# Patient Record
Sex: Female | Born: 1975 | Race: Black or African American | Hispanic: No | Marital: Married | Smoking: Never smoker
Health system: Southern US, Community
[De-identification: ages and names within clinical notes are randomized; demographics above are authoritative.]

## PROBLEM LIST (undated history)

## (undated) DIAGNOSIS — F329 Major depressive disorder, single episode, unspecified: Secondary | ICD-10-CM

## (undated) DIAGNOSIS — F32A Depression, unspecified: Secondary | ICD-10-CM

## (undated) DIAGNOSIS — E282 Polycystic ovarian syndrome: Secondary | ICD-10-CM

## (undated) DIAGNOSIS — Z8742 Personal history of other diseases of the female genital tract: Secondary | ICD-10-CM

## (undated) DIAGNOSIS — Z9889 Other specified postprocedural states: Secondary | ICD-10-CM

## (undated) DIAGNOSIS — N83209 Unspecified ovarian cyst, unspecified side: Secondary | ICD-10-CM

## (undated) HISTORY — DX: Other specified postprocedural states: Z98.890

## (undated) HISTORY — DX: Depression, unspecified: F32.A

## (undated) HISTORY — PX: OTHER SURGICAL HISTORY: SHX169

## (undated) HISTORY — DX: Unspecified ovarian cyst, unspecified side: N83.209

## (undated) HISTORY — DX: Major depressive disorder, single episode, unspecified: F32.9

## (undated) HISTORY — PX: CERVICAL CERCLAGE: SHX1329

## (undated) HISTORY — DX: Personal history of other diseases of the female genital tract: Z87.42

---
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
## 2005-09-08 ENCOUNTER — Emergency Department: Payer: Self-pay | Admitting: Emergency Medicine

## 2006-09-01 DIAGNOSIS — Z8742 Personal history of other diseases of the female genital tract: Secondary | ICD-10-CM

## 2006-09-01 HISTORY — DX: Personal history of other diseases of the female genital tract: Z87.42

## 2007-02-09 ENCOUNTER — Ambulatory Visit: Payer: Self-pay | Admitting: Internal Medicine

## 2007-09-08 ENCOUNTER — Emergency Department: Payer: Self-pay | Admitting: Emergency Medicine

## 2008-08-08 ENCOUNTER — Emergency Department: Payer: Self-pay | Admitting: Emergency Medicine

## 2009-10-02 ENCOUNTER — Emergency Department: Payer: Self-pay | Admitting: Emergency Medicine

## 2011-11-22 ENCOUNTER — Emergency Department: Payer: Self-pay | Admitting: Emergency Medicine

## 2012-05-08 ENCOUNTER — Emergency Department: Payer: Self-pay | Admitting: Emergency Medicine

## 2012-05-09 ENCOUNTER — Emergency Department: Payer: Self-pay | Admitting: Emergency Medicine

## 2012-05-11 ENCOUNTER — Emergency Department: Payer: Self-pay | Admitting: Emergency Medicine

## 2012-05-14 LAB — WOUND CULTURE

## 2012-08-18 ENCOUNTER — Other Ambulatory Visit (HOSPITAL_COMMUNITY)
Admission: RE | Admit: 2012-08-18 | Discharge: 2012-08-18 | Disposition: A | Discharge status: Home / Self Care | Payer: Commercial Managed Care - PPO | Source: Ambulatory Visit | Attending: Family Medicine | Admitting: Family Medicine

## 2012-08-18 ENCOUNTER — Encounter: Payer: Self-pay | Admitting: Family Medicine

## 2012-08-18 ENCOUNTER — Ambulatory Visit (INDEPENDENT_AMBULATORY_CARE_PROVIDER_SITE_OTHER): Payer: Commercial Managed Care - PPO | Admitting: Family Medicine

## 2012-08-18 VITALS — BP 122/70 | HR 72 | Temp 97.8°F | Ht 64.0 in | Wt 235.0 lb

## 2012-08-18 DIAGNOSIS — Z1151 Encounter for screening for human papillomavirus (HPV): Secondary | ICD-10-CM | POA: Insufficient documentation

## 2012-08-18 DIAGNOSIS — Z0000 Encounter for general adult medical examination without abnormal findings: Secondary | ICD-10-CM

## 2012-08-18 DIAGNOSIS — E669 Obesity, unspecified: Secondary | ICD-10-CM | POA: Insufficient documentation

## 2012-08-18 DIAGNOSIS — Z113 Encounter for screening for infections with a predominantly sexual mode of transmission: Secondary | ICD-10-CM

## 2012-08-18 DIAGNOSIS — Z01419 Encounter for gynecological examination (general) (routine) without abnormal findings: Secondary | ICD-10-CM | POA: Insufficient documentation

## 2012-08-18 DIAGNOSIS — Z136 Encounter for screening for cardiovascular disorders: Secondary | ICD-10-CM

## 2012-08-18 LAB — COMPREHENSIVE METABOLIC PANEL
ALT: 17 U/L (ref 0–35)
AST: 18 U/L (ref 0–37)
Albumin: 4.1 g/dL (ref 3.5–5.2)
Alkaline Phosphatase: 67 U/L (ref 39–117)
Calcium: 9.4 mg/dL (ref 8.4–10.5)
Chloride: 105 mEq/L (ref 96–112)
Potassium: 3.6 mEq/L (ref 3.5–5.1)
Sodium: 139 mEq/L (ref 135–145)
Total Protein: 7.5 g/dL (ref 6.0–8.3)

## 2012-08-18 LAB — HEMOGLOBIN A1C: Hgb A1c MFr Bld: 6.1 % (ref 4.6–6.5)

## 2012-08-18 LAB — LIPID PANEL
LDL Cholesterol: 31 mg/dL (ref 0–99)
Total CHOL/HDL Ratio: 2

## 2012-08-18 NOTE — Progress Notes (Signed)
Subjective:    Patient ID: Shirley Garcia, female    DOB: 08/06/1976, 36 y.o.   MRN: [id number]  HPI  G2P2 here to establish care and for CPX.  No h/o abnormal pap smears.  Last pap smear was before birth of her son who is now 68 yo. No known family history of cervical or ovarian CA. Great aunt had breast CA.  She is not currently sexually active.  She would like STD screening.  Denies any vaginal discharge or dysuria.  Weight gain- gained 80 pounds in last several years.  Wakes up in the middle of night craving sweets.  She does have a h/o gestational diabetes. No increased thirst or urination.   She is not physical active although she has an active job as Lawyer. Feels she is not taking in more calories but she admits to not watching her intake carefully.  Patient Active Problem List  Diagnosis  . Routine general medical examination at a health care facility  . Routine gynecological examination  . Obesity   Past Medical History  Diagnosis Date  . Depression   . Gestational diabetes mellitus in childbirth    Past Surgical History  Procedure Date  . Cervix polpys   . Cesarean section    History  Substance Use Topics  . Smoking status: Never Smoker   . Smokeless tobacco: Not on file  . Alcohol Use: Not on file   No family history on file. No Known Allergies No current outpatient prescriptions on file prior to visit.   The PMH, PSH, Social History, Family History, Medications, and allergies have been reviewed in [facility], and have been updated if relevant.   Review of Systems See HPI Patient reports no  vision/ hearing changes,anorexia,  fever ,adenopathy, persistant / recurrent hoarseness, swallowing issues, chest pain, edema,persistant / recurrent cough, hemoptysis, dyspnea(rest, exertional, paroxysmal nocturnal), gastrointestinal  bleeding (melena, rectal bleeding), abdominal pain, excessive heart burn, GU symptoms(dysuria, hematuria, pyuria, voiding/incontinence  Issues)  syncope, focal weakness, severe memory loss, concerning skin lesions, depression, anxiety, abnormal bruising/bleeding, major joint swelling, breast masses or abnormal vaginal bleeding.       Objective:   Physical Exam BP 122/70  Pulse 72  Temp 97.8 F (36.6 C)  Ht 5\' 4"  (1.626 m)  Wt 235 lb (106.595 kg)  BMI 40.34 kg/m2  General:  Obese,Well-developed,well-nourished,in no acute distress; alert,appropriate and cooperative throughout examination Head:  normocephalic and atraumatic.   Eyes:  vision grossly intact, pupils equal, pupils round, and pupils reactive to light.   Ears:  R ear normal and L ear normal.   Nose:  no external deformity.   Mouth:  good dentition.   Neck:  No deformities, masses, or tenderness noted. Breasts:  No mass, nodules, thickening, tenderness, bulging, retraction, inflamation, nipple discharge or skin changes noted.   Lungs:  Normal respiratory effort, chest expands symmetrically. Lungs are clear to auscultation, no crackles or wheezes. Heart:  Normal rate and regular rhythm. S1 and S2 normal without gallop, murmur, click, rub or other extra sounds. Abdomen:  Bowel sounds positive,abdomen soft and non-tender without masses, organomegaly or hernias noted. Rectal:  no external abnormalities.   Genitalia:  Pelvic Exam:        External: normal female genitalia without lesions or masses        Vagina: normal without lesions or masses        Cervix: normal without lesions or masses        Adnexa: normal bimanual exam without masses or  fullness        Uterus: normal by palpation        Pap smear: performed Msk:  No deformity or scoliosis noted of thoracic or lumbar spine.   Extremities:  No clubbing, cyanosis, edema, or deformity noted with normal full range of motion of all joints.   Neurologic:  alert & oriented X3 and gait normal.   Skin:  Intact without suspicious lesions or rashes Cervical Nodes:  No lymphadenopathy noted Axillary Nodes:  No palpable  lymphadenopathy Psych:  Cognition and judgment appear intact. Alert and cooperative with normal attention span and concentration. No apparent delusions, illusions, hallucinations     Assessment & Plan:   1. Routine general medical examination at a health care facility  Reviewed preventive care protocols, scheduled due services, and updated immunizations Discussed nutrition, exercise, diet, and healthy lifestyle.  Comprehensive metabolic panel  2. Routine gynecological examination  Discussed sexual activity, pregnancy risk, and STD risk.  Encouraged to get regular exercise.  pap  3. Obesity  Deteriorated.  Discussed keeping food journal and trying to increase physical activity.  Check blood work today.  If all normal, we could consider a trial of phentermine.  Discussed risks including HTN, stroke and pulmonary HTN with pt today. Hemoglobin A1c, TSH  4. Screening for ischemic heart disease  Lipid Panel  5. Screening for STD (sexually transmitted disease)  HIV Antibody, RPR

## 2012-08-18 NOTE — Patient Instructions (Addendum)
Good to see you. I hope you and your family have a wonderful Christmas.  We will call you with your lab results and send a letter with your pap smear results (unless it is abnormal and then we will call you).

## 2012-08-19 ENCOUNTER — Telehealth: Payer: Self-pay | Admitting: *Deleted

## 2012-08-19 LAB — RPR

## 2012-08-19 LAB — HIV ANTIBODY (ROUTINE TESTING W REFLEX): HIV: NONREACTIVE

## 2012-08-19 NOTE — Telephone Encounter (Signed)
Noted. Please print out phentermine rx.  She needs to follow up in 1 month.  Please also advise to take in am between 9 and 11 am.

## 2012-08-19 NOTE — Telephone Encounter (Signed)
Pt states she was told that if her blood work was normal she would be prescribed an appetite suppressant.  She would like to try something.  Please advise.

## 2012-08-20 MED ORDER — PHENTERMINE HCL 15 MG PO CAPS: 15.0000 mg | ORAL_CAPSULE | ORAL | Status: DC

## 2012-08-20 NOTE — Telephone Encounter (Signed)
Advised patient as instructed, script placed at front desk for pick up.  She said she will call back to schedule follow up appt.

## 2012-08-26 ENCOUNTER — Encounter: Payer: Self-pay | Admitting: Family Medicine

## 2012-08-26 LAB — HM PAP SMEAR: HM Pap smear: NORMAL

## 2012-09-15 ENCOUNTER — Encounter: Payer: Self-pay | Admitting: Family Medicine

## 2012-09-15 ENCOUNTER — Ambulatory Visit (INDEPENDENT_AMBULATORY_CARE_PROVIDER_SITE_OTHER): Payer: Commercial Managed Care - PPO | Admitting: Family Medicine

## 2012-09-15 VITALS — BP 120/76 | HR 80 | Temp 98.0°F | Wt 232.0 lb

## 2012-09-15 DIAGNOSIS — E669 Obesity, unspecified: Secondary | ICD-10-CM

## 2012-09-15 MED ORDER — PHENTERMINE HCL 30 MG PO CAPS: 30.0000 mg | ORAL_CAPSULE | ORAL | Status: DC

## 2012-09-15 NOTE — Progress Notes (Signed)
  Subjective:    Patient ID: Shirley Garcia, female    DOB: Oct 29, 1975, 37 y.o.   MRN: [id number]  HPI Very pleasant female her for follow up obesity.  Start Phentermine 15 mg daily last month.  Denies any CP, SOB, palpitations, HA or LE edema. She has also been exercising at the gym at [facility] a few days per week.  Has only lost 3 pounds which is a little frustrating for her.  Wt Readings from Last 3 Encounters:  09/15/12 232 lb (105.235 kg)  08/18/12 235 lb (106.595 kg)     Patient Active Problem List  Diagnosis  . Routine general medical examination at a health care facility  . Routine gynecological examination  . Obesity   Past Medical History  Diagnosis Date  . Depression   . Gestational diabetes mellitus in childbirth    Past Surgical History  Procedure Date  . Cervix polpys   . Cesarean section    History  Substance Use Topics  . Smoking status: Never Smoker   . Smokeless tobacco: Not on file  . Alcohol Use: Not on file   No family history on file. No Known Allergies Current Outpatient Prescriptions on File Prior to Visit  Medication Sig Dispense Refill  . phentermine 15 MG capsule Take 1 capsule (15 mg total) by mouth every morning.  30 capsule  0   The PMH, PSH, Social History, Family History, Medications, and allergies have been reviewed in [facility], and have been updated if relevant.    Review of Systems See HPI    Objective:   Physical Exam BP 120/76  Pulse 80  Temp 98 F (36.7 C)  Wt 232 lb (105.235 kg) Gen:  Alert, overweight appearing, NAD Resp:  CTA bilaterally CVS:  RRR Ext:  No edema    Assessment & Plan:   1. Obesity    Improved but has only lost a few pounds.  Will increase phentermine to 30 mg daily.  Follow up in 1 month.

## 2012-09-15 NOTE — Patient Instructions (Addendum)
Great to see you. We are increasing your phentermine to 30 mg- try to take it around 9 am.  Please follow up in 1 month.

## 2012-10-11 ENCOUNTER — Other Ambulatory Visit: Payer: Self-pay

## 2012-10-11 MED ORDER — PHENTERMINE HCL 30 MG PO CAPS: 30.0000 mg | ORAL_CAPSULE | ORAL | Status: DC

## 2012-10-11 NOTE — Telephone Encounter (Signed)
Medicine called to [facility], which is where pt requested script be sent.

## 2012-10-11 NOTE — Telephone Encounter (Signed)
Pt left v/m requesting refill phentermine 30 mg to [facility]; pt will be out of med 10/18/12 and has appt 10/21/12.Please advise.

## 2012-10-21 ENCOUNTER — Ambulatory Visit (INDEPENDENT_AMBULATORY_CARE_PROVIDER_SITE_OTHER): Payer: Commercial Managed Care - PPO | Admitting: Family Medicine

## 2012-10-21 ENCOUNTER — Encounter: Payer: Self-pay | Admitting: Family Medicine

## 2012-10-21 VITALS — BP 120/80 | HR 76 | Temp 97.6°F | Wt 224.0 lb

## 2012-10-21 DIAGNOSIS — E669 Obesity, unspecified: Secondary | ICD-10-CM

## 2012-10-21 MED ORDER — PHENTERMINE HCL 37.5 MG PO CAPS: 37.5000 mg | ORAL_CAPSULE | ORAL | Status: DC

## 2012-10-21 NOTE — Patient Instructions (Addendum)
Good to see you. Congratulations on your weight loss. We are increasing your phentermine to 37.5 mg daily.  Follow up in 1 month.

## 2012-10-21 NOTE — Progress Notes (Signed)
  Subjective:    Patient ID: Shirley Garcia, female    DOB: [birth year], 37 y.o.   MRN: [id number]  HPI Very pleasant female her for follow up obesity.  Start Phentermine 15 mg daily in December.  Denies any CP, SOB, palpitations, HA or LE edema. She has also been exercising at the gym at [facility] a few days per week.  Has done quite well.  Wt Readings from Last 3 Encounters:  10/21/12 224 lb (101.606 kg)  09/15/12 232 lb (105.235 kg)  08/18/12 235 lb (106.595 kg)      Patient Active Problem List  Diagnosis  . Obesity   Past Medical History  Diagnosis Date  . Depression   . Gestational diabetes mellitus in childbirth    Past Surgical History  Procedure Laterality Date  . Cervix polpys    . Cesarean section     History  Substance Use Topics  . Smoking status: Never Smoker   . Smokeless tobacco: Not on file  . Alcohol Use: Not on file   No family history on file. No Known Allergies Current Outpatient Prescriptions on File Prior to Visit  Medication Sig Dispense Refill  . phentermine 30 MG capsule Take 1 capsule (30 mg total) by mouth every morning.  30 capsule  0   No current facility-administered medications on file prior to visit.   The PMH, PSH, Social History, Family History, Medications, and allergies have been reviewed in [facility], and have been updated if relevant.    Review of Systems See HPI    Objective:   Physical Exam BP 120/80  Pulse 76  Temp(Src) 97.6 F (36.4 C)  Wt 224 lb (101.606 kg)  BMI 38.43 kg/m2 Gen:  Alert, overweight appearing, NAD Resp:  CTA bilaterally CVS:  RRR Ext:  No edema    Assessment & Plan:   Obesity Improved.   Will increase phentermine to 37.5  mg daily.  Follow up in 1 month. The patient indicates understanding of these issues and agrees with the plan.

## 2012-11-08 ENCOUNTER — Telehealth: Payer: Self-pay | Admitting: Family Medicine

## 2012-11-08 NOTE — Telephone Encounter (Signed)
Patient Information:  Caller Name: Danasha  Phone: [phone]  Patient: Shirley Garcia  Gender: Female  DOB: 1976/01/03  Age: 37 Years  PC[facility]  Pregnant: No  Office Follow Up:  Does the office need to follow up with this patient?: No  Instructions For The Office: N/A  RN Note:  Has been taking Motrin 600mg  every 8hrs but helps briefly.  Symptoms  Reason For Call & Symptoms: Thinks has pulled a muscle in lower back. Happened 3-3.  Reviewed Health History In EMR: Yes  Reviewed Medications In EMR: Yes  Reviewed Allergies In EMR: Yes  Reviewed Surgeries / Procedures: Yes  Date of Onset of Symptoms: 11/01/2012  Treatments Tried: ice heat Motrin  Treatments Tried Worked: No OB / GYN:  LMP: 10/16/2012  Guideline(s) Used:  Back Pain  Disposition Per Guideline:   See Today or Tomorrow in Office  Reason For Disposition Reached:   Patient wants to be seen  Advice Given:  Cold or Heat:  Heat Pack: If pain lasts over 2 days, apply heat to the sore area. Use a heat pack, heating pad, or warm wet washcloth. Do this for 10 minutes, then as needed. For widespread stiffness, take a hot bath or hot shower instead. Move the sore area under the warm water.

## 2012-11-19 ENCOUNTER — Other Ambulatory Visit: Payer: Self-pay

## 2012-11-19 MED ORDER — PHENTERMINE HCL 37.5 MG PO CAPS: 37.5000 mg | ORAL_CAPSULE | ORAL | Status: DC

## 2012-11-19 NOTE — Telephone Encounter (Signed)
Pt request ref[facility]. Pt will be out of med 11/20/12 and has appt with Dr Dayton Martes 11/22/12.Please advise.

## 2012-11-19 NOTE — Telephone Encounter (Signed)
[facility] 

## 2012-11-22 ENCOUNTER — Ambulatory Visit: Payer: Commercial Managed Care - PPO | Admitting: Family Medicine

## 2013-02-24 ENCOUNTER — Ambulatory Visit: Payer: Commercial Managed Care - PPO | Admitting: Family Medicine

## 2013-02-25 ENCOUNTER — Ambulatory Visit: Payer: Commercial Managed Care - PPO | Admitting: Family Medicine

## 2013-02-28 ENCOUNTER — Encounter: Payer: Self-pay | Admitting: *Deleted

## 2013-03-01 ENCOUNTER — Encounter: Payer: Self-pay | Admitting: Family Medicine

## 2013-03-01 ENCOUNTER — Ambulatory Visit (INDEPENDENT_AMBULATORY_CARE_PROVIDER_SITE_OTHER): Payer: Commercial Managed Care - PPO | Admitting: Family Medicine

## 2013-03-01 VITALS — BP 110/80 | HR 72 | Temp 97.8°F | Wt 227.0 lb

## 2013-03-01 DIAGNOSIS — N63 Unspecified lump in unspecified breast: Secondary | ICD-10-CM

## 2013-03-01 DIAGNOSIS — N6320 Unspecified lump in the left breast, unspecified quadrant: Secondary | ICD-10-CM | POA: Insufficient documentation

## 2013-03-01 NOTE — Progress Notes (Signed)
  Subjective:    Patient ID: Shirley Garcia, female    DOB: 1976-02-26, 37 y.o.   MRN: [id number]  HPI 37 yo pleasant female here for left breast pain and ? Breast mass x 2-3 days. Not sure if she is about to start her period- it has been irregular.  No nipple changes or nipple discharge.  No malaise, fevers or night sweats.  No family history of breast cancer.  Patient Active Problem List   Diagnosis Date Noted  . Left breast mass 03/01/2013  . Obesity 08/18/2012   Past Medical History  Diagnosis Date  . Depression   . Gestational diabetes mellitus in childbirth    Past Surgical History  Procedure Laterality Date  . Cervix polpys    . Cesarean section     History  Substance Use Topics  . Smoking status: Never Smoker   . Smokeless tobacco: Not on file  . Alcohol Use: Not on file   No family history on file. No Known Allergies No current outpatient prescriptions on file prior to visit.   No current facility-administered medications on file prior to visit.   The PMH, PSH, Social History, Family History, Medications, and allergies have been reviewed in [facility], and have been updated if relevant.    Review of Systems See HPI    Objective:   Physical Exam  Constitutional: She appears well-developed and well-nourished. No distress.  HENT:  Head: Normocephalic.  Pulmonary/Chest:    Skin: Skin is warm and dry.  Psychiatric: She has a normal mood and affect. Her speech is normal and behavior is normal. Judgment and thought content normal. Cognition and memory are normal.   BP 110/80  Pulse 72  Temp(Src) 97.8 F (36.6 C)  Wt 227 lb (102.967 kg)  BMI 38.95 kg/m2        Assessment & Plan:  1. Left breast mass New- diag mammogram with ultrasound for further evaluation. The patient indicates understanding of these issues and agrees with the plan.  - MM Digital Diagnostic Bilat; Future

## 2013-03-01 NOTE — Patient Instructions (Addendum)
Good to see you. Please stop by to see Shirley Garcia on your way out to set up your mammogram. If she is not here yet, she will call you.  Think of some days you have off in the next week or two.

## 2013-03-10 ENCOUNTER — Ambulatory Visit: Payer: Self-pay | Admitting: Family Medicine

## 2013-03-11 ENCOUNTER — Encounter: Payer: Self-pay | Admitting: Family Medicine

## 2014-07-10 ENCOUNTER — Emergency Department: Payer: Self-pay | Admitting: Emergency Medicine

## 2015-04-03 ENCOUNTER — Ambulatory Visit (INDEPENDENT_AMBULATORY_CARE_PROVIDER_SITE_OTHER): Payer: Commercial Managed Care - PPO | Admitting: Family Medicine

## 2015-04-03 ENCOUNTER — Encounter: Payer: Self-pay | Admitting: Family Medicine

## 2015-04-03 ENCOUNTER — Other Ambulatory Visit (HOSPITAL_COMMUNITY)
Admission: RE | Admit: 2015-04-03 | Discharge: 2015-04-03 | Disposition: A | Discharge status: Home / Self Care | Payer: Commercial Managed Care - PPO | Source: Ambulatory Visit | Attending: Family Medicine | Admitting: Family Medicine

## 2015-04-03 VITALS — BP 122/72 | HR 72 | Temp 97.7°F | Ht 63.25 in | Wt 248.5 lb

## 2015-04-03 DIAGNOSIS — N760 Acute vaginitis: Secondary | ICD-10-CM | POA: Diagnosis present

## 2015-04-03 DIAGNOSIS — Z113 Encounter for screening for infections with a predominantly sexual mode of transmission: Secondary | ICD-10-CM | POA: Diagnosis present

## 2015-04-03 DIAGNOSIS — Z01419 Encounter for gynecological examination (general) (routine) without abnormal findings: Secondary | ICD-10-CM | POA: Insufficient documentation

## 2015-04-03 DIAGNOSIS — Z1151 Encounter for screening for human papillomavirus (HPV): Secondary | ICD-10-CM | POA: Diagnosis present

## 2015-04-03 DIAGNOSIS — Z0000 Encounter for general adult medical examination without abnormal findings: Secondary | ICD-10-CM | POA: Diagnosis not present

## 2015-04-03 LAB — COMPREHENSIVE METABOLIC PANEL
ALT: 16 U/L (ref 0–35)
AST: 15 U/L (ref 0–37)
Albumin: 4.3 g/dL (ref 3.5–5.2)
Alkaline Phosphatase: 58 U/L (ref 39–117)
BUN: 12 mg/dL (ref 6–23)
CALCIUM: 9.6 mg/dL (ref 8.4–10.5)
CO2: 29 mEq/L (ref 19–32)
Chloride: 105 mEq/L (ref 96–112)
Creatinine, Ser: 0.64 mg/dL (ref 0.40–1.20)
GFR: 133.42 mL/min (ref >60.00)
GLUCOSE: 91 mg/dL (ref 70–99)
Potassium: 3.8 mEq/L (ref 3.5–5.1)
Sodium: 139 mEq/L (ref 135–145)
Total Bilirubin: 0.4 mg/dL (ref 0.2–1.2)
Total Protein: 7.4 g/dL (ref 6.0–8.3)

## 2015-04-03 LAB — LIPID PANEL
Cholesterol: 124 mg/dL (ref 0–200)
HDL: 62.3 mg/dL (ref >39.00)
LDL Cholesterol: 45 mg/dL (ref 0–99)
NonHDL: 61.58
TRIGLYCERIDES: 81 mg/dL (ref 0.0–149.0)
Total CHOL/HDL Ratio: 2
VLDL: 16.2 mg/dL (ref 0.0–40.0)

## 2015-04-03 LAB — CBC WITH DIFFERENTIAL/PLATELET
BASOS ABS: 0 10*3/uL (ref 0.0–0.1)
Basophils Relative: 0.6 % (ref 0.0–3.0)
EOS ABS: 0.4 10*3/uL (ref 0.0–0.7)
Eosinophils Relative: 4.3 % (ref 0.0–5.0)
HEMATOCRIT: 40 % (ref 36.0–46.0)
HEMOGLOBIN: 13.4 g/dL (ref 12.0–15.0)
LYMPHS ABS: 3.9 10*3/uL (ref 0.7–4.0)
Lymphocytes Relative: 46.7 % — ABNORMAL HIGH (ref 12.0–46.0)
MCHC: 33.5 g/dL (ref 30.0–36.0)
MCV: 89.2 fl (ref 78.0–100.0)
Monocytes Absolute: 0.6 10*3/uL (ref 0.1–1.0)
Monocytes Relative: 7.5 % (ref 3.0–12.0)
NEUTROS PCT: 40.9 % — AB (ref 43.0–77.0)
Neutro Abs: 3.4 10*3/uL (ref 1.4–7.7)
PLATELETS: 300 10*3/uL (ref 150.0–400.0)
RBC: 4.48 Mil/uL (ref 3.87–5.11)
RDW: 14.1 % (ref 11.5–15.5)
WBC: 8.3 10*3/uL (ref 4.0–10.5)

## 2015-04-03 LAB — TSH: TSH: 1.15 u[IU]/mL (ref 0.35–4.50)

## 2015-04-03 LAB — HIV ANTIBODY (ROUTINE TESTING W REFLEX): HIV: NONREACTIVE

## 2015-04-03 NOTE — Progress Notes (Signed)
Pre visit review using our clinic review tool, if applicable. No additional management support is needed unless otherwise documented below in the visit note. 

## 2015-04-03 NOTE — Progress Notes (Signed)
Subjective:    Patient ID: Shirley Garcia, female    DOB: 1976/05/22, 39 y.o.   MRN: [id number]  HPI  39 yo G2P2 here for CPX and follow up of chronic medical conditions.  No h/o abnormal pap smears.  Last pap smear was 08/18/12- done by me. No known family history of cervical or ovarian CA. Great aunt had breast CA.   She would like STD screening.  Denies any vaginal discharge or dysuria.  Wt Readings from Last 3 Encounters:  04/03/15 248 lb 8 oz (112.719 kg)  03/01/13 227 lb (102.967 kg)  10/21/12 224 lb (101.606 kg)     No results found for: WBC, HGB, HCT, MCV, PLT  Lab Results  Component Value Date   CREATININE 0.6 08/18/2012   Lab Results  Component Value Date   CHOL 110 08/18/2012   HDL 66.80 08/18/2012   LDLCALC 31 08/18/2012   TRIG 62.0 08/18/2012   CHOLHDL 2 08/18/2012   Lab Results  Component Value Date   ALT 17 08/18/2012   AST 18 08/18/2012   ALKPHOS 67 08/18/2012   BILITOT 0.3 08/18/2012   Lab Results  Component Value Date   TSH 0.74 08/18/2012   No results found for: WBC, HGB, HCT, MCV, PLT   Patient Active Problem List   Diagnosis Date Noted  . Well woman exam 04/03/2015  . Left breast mass 03/01/2013  . Obesity 08/18/2012   Past Medical History  Diagnosis Date  . Depression   . Gestational diabetes mellitus in childbirth    Past Surgical History  Procedure Laterality Date  . Cervix polpys    . Cesarean section     History  Substance Use Topics  . Smoking status: Never Smoker   . Smokeless tobacco: Not on file  . Alcohol Use: Not on file   No family history on file. No Known Allergies No current outpatient prescriptions on file prior to visit.   No current facility-administered medications on file prior to visit.   The PMH, PSH, Social History, Family History, Medications, and allergies have been reviewed in [facility], and have been updated if relevant.   Review of Systems  Constitutional: Negative.   HENT: Negative.    Eyes: Negative.   Respiratory: Negative.   Cardiovascular: Negative.   Gastrointestinal: Negative.   Endocrine: Negative.   Genitourinary: Positive for menstrual problem.  Musculoskeletal: Negative.   Skin: Negative.   Allergic/Immunologic: Negative.   Neurological: Negative.   Hematological: Negative.   Psychiatric/Behavioral: Negative.   All other systems reviewed and are negative.      Objective:   Physical Exam BP 122/72 mmHg  Pulse 72  Temp(Src) 97.7 F (36.5 C) (Oral)  Ht 5' 3.25" (1.607 m)  Wt 248 lb 8 oz (112.719 kg)  BMI 43.65 kg/m2  SpO2 97%  LMP 03/02/2015 (Within Weeks)  General:  Obese,Well-developed,well-nourished,in no acute distress; alert,appropriate and cooperative throughout examination Head:  normocephalic and atraumatic.   Eyes:  vision grossly intact, pupils equal, pupils round, and pupils reactive to light.   Ears:  R ear normal and L ear normal.   Nose:  no external deformity.   Mouth:  good dentition.   Neck:  No deformities, masses, or tenderness noted. Breasts:  No mass, nodules, thickening, tenderness, bulging, retraction, inflamation, nipple discharge or skin changes noted.   Lungs:  Normal respiratory effort, chest expands symmetrically. Lungs are clear to auscultation, no crackles or wheezes. Heart:  Normal rate and regular rhythm. S1 and S2 normal  without gallop, murmur, click, rub or other extra sounds. Abdomen:  Bowel sounds positive,abdomen soft and non-tender without masses, organomegaly or hernias noted. Rectal:  no external abnormalities.   Genitalia:  Pelvic Exam:        External: normal female genitalia without lesions or masses        Vagina: normal without lesions or masses        Cervix: normal without lesions or masses        Adnexa: normal bimanual exam without masses or fullness        Uterus: normal by palpation        Pap smear: performed Msk:  No deformity or scoliosis noted of thoracic or lumbar spine.   Extremities:   No clubbing, cyanosis, edema, or deformity noted with normal full range of motion of all joints.   Neurologic:  alert & oriented X3 and gait normal.   Skin:  Intact without suspicious lesions or rashes Cervical Nodes:  No lymphadenopathy noted Axillary Nodes:  No palpable lymphadenopathy Psych:  Cognition and judgment appear intact. Alert and cooperative with normal attention span and concentration. No apparent delusions, illusions, hallucinations     Assessment & Plan:

## 2015-04-03 NOTE — Assessment & Plan Note (Signed)
Reviewed preventive care protocols, scheduled due services, and updated immunizations Discussed nutrition, exercise, diet, and healthy lifestyle.  Pap smear, STD screening today.  Orders Placed This Encounter  Procedures  . CBC with Differential/Platelet  . Comprehensive metabolic panel  . Lipid panel  . TSH  . HIV antibody (with reflex)  . RPR

## 2015-04-03 NOTE — Addendum Note (Signed)
Addended by: Modena Nunnery on: 04/03/2015 01:26 PM   Modules accepted: Orders

## 2015-04-04 ENCOUNTER — Encounter: Payer: Self-pay | Admitting: Family Medicine

## 2015-04-04 ENCOUNTER — Encounter: Payer: Self-pay | Admitting: *Deleted

## 2015-04-04 LAB — CERVICOVAGINAL ANCILLARY ONLY: CANDIDA VAGINITIS: NEGATIVE

## 2015-04-04 LAB — RPR

## 2015-04-04 LAB — CYTOLOGY - PAP

## 2015-04-05 ENCOUNTER — Other Ambulatory Visit: Payer: Self-pay | Admitting: Family Medicine

## 2015-04-05 MED ORDER — METRONIDAZOLE 500 MG PO TABS: 500.0000 mg | ORAL_TABLET | Freq: Two times a day (BID) | ORAL | Status: DC

## 2015-04-06 ENCOUNTER — Encounter: Payer: Self-pay | Admitting: Family Medicine

## 2015-04-08 ENCOUNTER — Other Ambulatory Visit: Payer: Self-pay | Admitting: Family Medicine

## 2015-04-08 MED ORDER — NORETHIN-ETH ESTRAD-FE BIPHAS 1 MG-10 MCG / 10 MCG PO TABS: 1.0000 | ORAL_TABLET | Freq: Every day | ORAL | Status: DC

## 2015-04-09 LAB — CERVICOVAGINAL ANCILLARY ONLY: HERPES (WINDOWPATH): NEGATIVE

## 2015-04-13 ENCOUNTER — Encounter: Payer: Self-pay | Admitting: Family Medicine

## 2015-05-30 ENCOUNTER — Telehealth: Payer: Self-pay | Admitting: Family Medicine

## 2015-05-30 ENCOUNTER — Encounter: Payer: Self-pay | Admitting: Family Medicine

## 2015-05-30 ENCOUNTER — Encounter: Payer: Self-pay | Admitting: Internal Medicine

## 2015-05-30 ENCOUNTER — Ambulatory Visit (INDEPENDENT_AMBULATORY_CARE_PROVIDER_SITE_OTHER): Payer: Commercial Managed Care - PPO | Admitting: Internal Medicine

## 2015-05-30 VITALS — BP 126/84 | HR 89 | Temp 97.9°F | Wt 248.0 lb

## 2015-05-30 DIAGNOSIS — R1033 Periumbilical pain: Secondary | ICD-10-CM

## 2015-05-30 NOTE — Progress Notes (Signed)
Pre visit review using our clinic review tool, if applicable. No additional management support is needed unless otherwise documented below in the visit note. 

## 2015-05-30 NOTE — Progress Notes (Signed)
Subjective:    Patient ID: Shirley Garcia, female    DOB: [birth year], 39 y.o.   MRN: [id number]  HPI  Pt presents to the clinic today with c/o periumbilical abdominal pain. This started yesterday. She has noticed a small lump in the area. It is sore to touch. She denies nausea, vomiting or changes in her bowels. She does do a lot of heavy lifting at work. She did have a Therapist, sports at her job look at it and they thought that it could be a possible hernia. She has never had a hernia in the past.  Review of Systems      Past Medical History  Diagnosis Date  . Depression   . Gestational diabetes mellitus in childbirth     Current Outpatient Prescriptions  Medication Sig Dispense Refill  . metroNIDAZOLE (FLAGYL) 500 MG tablet Take 1 tablet (500 mg total) by mouth 2 (two) times daily. 14 tablet 0  . Norethindrone-Ethinyl Estradiol-Fe Biphas (LO LOESTRIN FE) 1 MG-10 MCG / 10 MCG tablet Take 1 tablet by mouth daily. 1 Package 11   No current facility-administered medications for this visit.    No Known Allergies  No family history on file.  Social History   Social History  . Marital Status: Single    Spouse Name: N/A  . Number of Children: N/A  . Years of Education: N/A   Occupational History  . Not on file.   Social History Main Topics  . Smoking status: Never Smoker   . Smokeless tobacco: Not on file  . Alcohol Use: Not on file  . Drug Use: Not on file  . Sexual Activity: Not on file   Other Topics Concern  . Not on file   Social History Narrative   CNA at [employer].   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  Respiratory: Denies difficulty breathing, shortness of breath, cough or sputum production.   Cardiovascular: Denies chest pain, chest tightness, palpitations or swelling in the hands or feet.  Gastrointestinal: Pt reports abdominal pain. Denies bloating, constipation, diarrhea or blood in the stool.  GU:  Denies urgency, frequency, pain with urination, burning sensation, blood in urine, odor or discharge.  No other specific complaints in a complete review of systems (except as listed in HPI above).  Objective:   Physical Exam   BP 126/84 mmHg  Pulse 89  Temp(Src) 97.9 F (36.6 C) (Oral)  Wt 248 lb (112.492 kg)  SpO2 98%  LMP 04/09/2015 Wt Readings from Last 3 Encounters:  05/30/15 248 lb (112.492 kg)  04/03/15 248 lb 8 oz (112.719 kg)  03/01/13 227 lb (102.967 kg)    General: Appears her stated age, obese in NAD. Cardiovascular: Normal rate and rhythm. S1,S2 noted.   Pulmonary/Chest: Normal effort and positive vesicular breath sounds. No respiratory distress. No wheezes, rales or ronchi noted.  Abdomen: Soft and tender just left to the umbilicus. Normal bowel sounds, no bruits noted. No distention or masses noted.  Neurological: Alert and oriented.   BMET    Component Value Date/Time   NA 139 04/03/2015 1336   K 3.8 04/03/2015 1336   CL 105 04/03/2015 1336   CO2 29 04/03/2015 1336   GLUCOSE 91 04/03/2015 1336   BUN 12 04/03/2015 1336   CREATININE 0.64 04/03/2015 1336   CALCIUM 9.6 04/03/2015 1336    Lipid Panel     Component Value Date/Time   CHOL 124 04/03/2015 1336  TRIG 81.0 04/03/2015 1336   HDL 62.30 04/03/2015 1336   CHOLHDL 2 04/03/2015 1336   VLDL 16.2 04/03/2015 1336   LDLCALC 45 04/03/2015 1336    CBC    Component Value Date/Time   WBC 8.3 04/03/2015 1336   RBC 4.48 04/03/2015 1336   HGB 13.4 04/03/2015 1336   HCT 40.0 04/03/2015 1336   PLT 300.0 04/03/2015 1336   MCV 89.2 04/03/2015 1336   MCHC 33.5 04/03/2015 1336   RDW 14.1 04/03/2015 1336   LYMPHSABS 3.9 04/03/2015 1336   MONOABS 0.6 04/03/2015 1336   EOSABS 0.4 04/03/2015 1336   BASOSABS 0.0 04/03/2015 1336    Hgb A1C Lab Results  Component Value Date   HGBA1C 6.1 08/18/2012        Assessment & Plan:   Periumbilical abdominal pain:  No obvious hernia noted Discussed  conservative management versus further evaluation She prefers conservative management RX for abdominal binder to wear while at work Try to avoid heavy lifting if at all possible If pain persist, consider CT scan of abdomen  RTC as needed or if symptoms persist or worsen

## 2015-05-30 NOTE — Telephone Encounter (Signed)
Pt has appt 05/30/15 at 2 pm with Webb Silversmith NP.

## 2015-05-30 NOTE — Telephone Encounter (Signed)
PLEASE NOTE: All timestamps contained within this report are represented as [place] Standard Time. CONFIDENTIALTY NOTICE: This fax transmission is intended only for the addressee. It contains information that is legally privileged, confidential or otherwise protected from use or disclosure. If you are not the intended recipient, you are strictly prohibited from reviewing, disclosing, copying using or disseminating any of this information or taking any action in reliance on or regarding this information. If you have received this fax in error, please notify us immediately by telephone so that we can arrange for its return to [place]. Phone: [phone], Toll-Free: [phone], Fax: [phone] Page: 1 of 1 Call Id: [id number] [place] Patient Name: Shirley Garcia DOB: Apr 29, 1976 Initial Comment Caller states she has abd pain, poss hernia. Nurse Assessment Nurse: Marcelline Deist, RN, [name] Date/Time (Eastern Time): 05/30/2015 10:34:09 AM Confirm and document reason for call. If symptomatic, describe symptoms. ---Caller states she has abdominal pain around belly button area. Feels hard, nurse checked it & thought it could be a possible hernia. No fever. Has had symptoms for last couple days. Does lifting with her job. Has the patient traveled out of the country within the last 30 days? ---Not Applicable Does the patient require triage? ---Yes Related visit to physician within the last 2 weeks? ---No Does the PT have any chronic conditions? (i.e. diabetes, asthma, etc.) ---No Did the patient indicate they were pregnant? ---No Guidelines Guideline Title Affirmed Question Affirmed Notes Abdominal Pain - Female [1] MODERATE (e.g., interferes with normal activities) AND [2] pain comes and goes (cramps) AND [3] present > 24 hours (Exception: pain with Vomiting or Diarrhea - see that Guideline) Final Disposition  User See Physician within [name], RN, [employer] Referrals REFERRED TO PCP OFFICE Disagree/Comply: Leta Baptist

## 2015-05-30 NOTE — Patient Instructions (Signed)

## 2015-06-01 ENCOUNTER — Other Ambulatory Visit: Payer: Self-pay | Admitting: Family Medicine

## 2015-06-01 ENCOUNTER — Encounter: Payer: Self-pay | Admitting: Family Medicine

## 2015-06-01 DIAGNOSIS — R1033 Periumbilical pain: Secondary | ICD-10-CM

## 2015-06-05 ENCOUNTER — Telehealth: Payer: Self-pay

## 2015-06-05 DIAGNOSIS — R109 Unspecified abdominal pain: Secondary | ICD-10-CM

## 2015-06-05 NOTE — Telephone Encounter (Signed)
Shirley Garcia with [facility] CT scan dept left v/m; needs order corrected to read CT scan of abd and pelvis with contrast to go along with diagnosis given.

## 2015-06-05 NOTE — Telephone Encounter (Signed)
Noted.  Order changed.

## 2015-06-07 ENCOUNTER — Ambulatory Visit
Admission: RE | Admit: 2015-06-07 | Discharge: 2015-06-07 | Disposition: A | Discharge status: Home / Self Care | Payer: Commercial Managed Care - PPO | Source: Ambulatory Visit | Attending: Family Medicine | Admitting: Family Medicine

## 2015-06-07 DIAGNOSIS — R109 Unspecified abdominal pain: Secondary | ICD-10-CM | POA: Diagnosis present

## 2015-06-07 MED ORDER — IOHEXOL 350 MG/ML SOLN
100.0000 mL | Freq: Once | INTRAVENOUS | Status: AC | PRN
  Administered 2015-06-07: 100 mL via INTRAVENOUS

## 2015-06-08 ENCOUNTER — Encounter: Payer: Self-pay | Admitting: Family Medicine

## 2015-06-08 ENCOUNTER — Other Ambulatory Visit: Payer: Self-pay | Admitting: Family Medicine

## 2015-06-08 DIAGNOSIS — D270 Benign neoplasm of right ovary: Secondary | ICD-10-CM

## 2015-06-22 ENCOUNTER — Other Ambulatory Visit: Payer: Self-pay | Admitting: Internal Medicine

## 2015-06-22 MED ORDER — HYDROCODONE-HOMATROPINE 5-1.5 MG/5ML PO SYRP: 5.0000 mL | ORAL_SOLUTION | Freq: Three times a day (TID) | ORAL | Status: DC | PRN

## 2015-06-23 ENCOUNTER — Other Ambulatory Visit: Payer: Self-pay | Admitting: Internal Medicine

## 2015-06-23 ENCOUNTER — Encounter: Payer: Self-pay | Admitting: Internal Medicine

## 2015-06-25 MED ORDER — HYDROCODONE-HOMATROPINE 5-1.5 MG/5ML PO SYRP: 5.0000 mL | ORAL_SOLUTION | Freq: Three times a day (TID) | ORAL | Status: DC | PRN

## 2015-06-25 NOTE — Addendum Note (Signed)
Addended by: Lurlean Nanny on: 06/25/2015 09:49 AM   Modules accepted: Orders

## 2015-06-25 NOTE — Telephone Encounter (Signed)
This was just filled on Friday. It looks as if she never came and picked up the RX. It can not be sent electronically.

## 2015-06-25 NOTE — Telephone Encounter (Signed)
Rx placed in front office for pick up---msg sent through mychart to notify pt

## 2015-06-29 ENCOUNTER — Other Ambulatory Visit: Payer: Self-pay | Admitting: Internal Medicine

## 2015-06-29 MED ORDER — HYDROCOD POLST-CPM POLST ER 10-8 MG/5ML PO SUER: 5.0000 mL | Freq: Two times a day (BID) | ORAL | Status: DC | PRN

## 2015-06-29 NOTE — Telephone Encounter (Signed)
Rx left in front office for pick up and pt is aware  

## 2015-07-24 ENCOUNTER — Encounter: Payer: Self-pay | Admitting: Obstetrics and Gynecology

## 2015-07-24 ENCOUNTER — Ambulatory Visit (INDEPENDENT_AMBULATORY_CARE_PROVIDER_SITE_OTHER): Payer: Commercial Managed Care - PPO | Admitting: Obstetrics and Gynecology

## 2015-07-24 VITALS — BP 115/72 | HR 73 | Ht 65.0 in | Wt 245.6 lb

## 2015-07-24 DIAGNOSIS — R1033 Periumbilical pain: Secondary | ICD-10-CM

## 2015-07-24 DIAGNOSIS — N83201 Unspecified ovarian cyst, right side: Secondary | ICD-10-CM | POA: Diagnosis not present

## 2015-07-24 NOTE — Patient Instructions (Signed)
Your surgery is scheduled for September 03, 2015.  You will be scheduled for a pre-operative appointment on August 15, 2015.    Ovarian Cystectomy Ovarian cystectomy is surgery to remove a fluid-filled sac (cyst) on an ovary. The ovaries are small organs that produce eggs in women. Various types of cysts can form on the ovaries. Most are not cancerous. Surgery may be done if a cyst is large or is causing symptoms such as pain. It may also be done for a cyst that is or might be cancerous. This surgery can be done using a laparoscopic technique or an open abdominal technique. The laparoscopic technique involves smaller cuts (incisions) and a faster recovery time. The technique used will depend on your age, the type of cyst, and whether the cyst is cancerous. The laparoscopic technique is not used for a cancerous cyst. LET [facility] CARE PROVIDER KNOW ABOUT:   Any allergies you have.  All medicines you are taking, including vitamins, herbs, eye drops, creams, and over-the-counter medicines.  Previous problems you or members of your family have had with the use of anesthetics.  Any blood disorders you have.  Previous surgeries you have had.  Medical conditions you have.  Any chance you might be pregnant. RISKS AND COMPLICATIONS Generally, this is a safe procedure. However, as with any procedure, complications can occur. Possible complications include:  Excessive bleeding.  Infection.  Injury to other organs.  Blood clots.  Becoming incapable of getting pregnant (infertile). BEFORE THE PROCEDURE  Ask your health care provider about changing or stopping any regular medicines. Avoid taking aspirin, ibuprofen, or blood thinners as directed by your health care provider.  Do not eat or drink anything after midnight the night before surgery.  If you smoke, do not smoke for at least 2 weeks before your surgery.  Do not drink alcohol the day before your surgery.  Let your health care  provider know if you develop a cold or any infection before your surgery.  Arrange for someone to drive you home after the procedure or after your hospital stay. Also arrange for someone to help you with activities during recovery. PROCEDURE  Either a laparoscopic technique or an open abdominal technique may be used for this surgery.  Small monitors will be put on your body. They are used to check your heart, blood pressure, and oxygen level.   An IV access tube will be put into one of your veins. Medicine will be able to flow directly into your body through this IV tube.   You might be given a medicine to help you relax (sedative).   You will be given a medicine to make you sleep (general anesthetic). A breathing tube may be placed into your lungs during the procedure. Laparoscopic Technique  Several small cuts (incisions) are made in your abdomen. These are typically about 1 to 2 cm long.   Your abdomen will be filled with carbon dioxide gas so that it expands. This gives the surgeon more room to operate and makes your organs easier to see.   A thin, lighted tube with a tiny camera on the end (laparoscope) is put through one of the small incisions. The camera on the laparoscope sends a picture to a TV screen in the operating room. This gives the surgeon a good view inside your abdomen.   Hollow tubes are put through the other small incisions in your abdomen. The tools needed for the procedure are put through these tubes.  The ovary  with the cyst is identified, and the cyst is removed. It is sent to the lab for testing. If it is cancer, both ovaries may need to be removed during a different surgery.  Tools are removed. The incisions are then closed with stitches or skin glue, and dressings may be applied. Open Abdominal Technique  A single large incision is made along your bikini line or in the middle of your lower abdomen.  The ovary with the cyst is identified, and the cyst is  removed. It is sent to the lab for testing. If it is cancer, both ovaries may need to be removed during a different surgery.  The incision is then closed with stitches or staples. AFTER THE PROCEDURE   You will wake up from anesthesia and be taken to a recovery area.  If you had laparoscopic surgery, you may be able to go home the same day, or you may need to stay in the hospital overnight.  If you had open abdominal surgery, you will need to stay in the hospital for a few days.  Your IV access tube and catheter will be removed the first or second day, after you are able to eat and drink enough.  You may be given medicine to relieve pain or to help you sleep.  You may be given an antibiotic medicine if needed.   This information is not intended to replace advice given to you by your health care provider. Make sure you discuss any questions you have with your health care provider.   Document Released: 06/15/2007 Document Revised: 06/08/2013 Document Reviewed: 03/30/2013 Elsevier Interactive Patient Education [employer].

## 2015-07-24 NOTE — Progress Notes (Signed)
GYNECOLOGY PROGRESS NOTE  Subjective:    Patient ID: Shirley Garcia, female    DOB: [birth year], 39 y.o.   MRN: [id number]  HPI  Patient is a 39 y.o. P2002. female who presents for referral for ovarian cyst. Was initially seen by PCP for intermittent umbilical pain ~ 1 month ago.  Had CT scan performed which noted right ovarian cyst (suspected dermoid), ~ 4 cm.  Currently denies pain.    Past Medical History  Diagnosis Date  . Depression   . Ovarian cyst   . History of PCOS   . H/O cervical polypectomy 2008    Family History  Problem Relation Age of Onset  . Hypertension Mother   . Hypertension Sister   . Diabetes Maternal Grandfather     Past Surgical History  Procedure Laterality Date  . Cervix polpys    . Cystectomy Bilateral 1996  . Cervical cerclage    . Cesarean section      x1    Social History   Social History  . Marital Status: Single    Spouse Name: N/A  . Number of Children: N/A  . Years of Education: N/A   Occupational History  . Not on file.   Social History Main Topics  . Smoking status: Never Smoker   . Smokeless tobacco: Not on file  . Alcohol Use: No  . Drug Use: No  . Sexual Activity: No   Other Topics Concern  . 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 [DISCONTINUED] chlorpheniramine-HYDROcodone (TUSSIONEX PENNKINETIC ER) 10-8 MG/5ML SUER Take 5 mLs by mouth every 12 (twelve) hours as needed for cough.  . [DISCONTINUED] HYDROcodone-homatropine (HYCODAN) 5-1.5 MG/5ML syrup Take 5 mLs by mouth every 8 (eight) hours as needed for cough.   No facility-administered encounter medications on file as of 07/24/2015.     No Known Allergies   Review of Systems Pertinent items noted in HPI and remainder of comprehensive ROS otherwise negative.   Objective:   Blood pressure 115/72, pulse 73, height 5\' 5"  (1.651 m), weight 245 lb 9.6 oz  (111.403 kg), last menstrual period 07/17/2015. General appearance: alert and no distress Abdomen: soft, non-tender; bowel sounds normal; no masses,  no organomegaly Pelvic: cervix normal in appearance, external genitalia normal, no cervical motion tenderness, positive findings: adnexal mass slightly palpable on right (fullness noted), nontender.  Left adnexa without masses or tenderness, rectovaginal septum normal, uterus normal size, shape, and consistency and vagina normal without discharge Extremities: extremities normal, atraumatic, no cyanosis or edema Neurologic: Grossly normal   Imaging - 06/07/2015 CT Abdomen/Pelvis:  FINDINGS: Lung bases are free of acute infiltrate or sizable effusion.  The liver, gallbladder, spleen, adrenal glands and pancreas are within normal limits. The kidneys are well visualized bilaterally and demonstrate a normal enhancement pattern. No calculi or obstructive changes are noted.  The appendix is air-filled and within normal limits. The bladder is well distended. The uterus is well visualized. Fluid is noted within the endometrial canal likely related the patient's current menstrual status.  Adjacent to the uterus to the right of the midline is a fat containing lesion which measures approximately 4.1 by 3.2 cm. A few small flecks of calcium are noted within. These changes likely represent an ovarian dermoid. The left ovary appears within normal limits. No free pelvic fluid is noted. No findings to suggest diverticulitis are  seen. The osseous structures are within normal limits.   Assessment:   Right ovarian cyst (dermoid)  Plan:   - Discussion had with patient regarding CT scan results, and need for intervention.  Advised that dermoid cysts are typically removed surgically.  Patient notes understanding, has h/o surgically removed cyst in the past.  Desires to have surgery in January.  Will schedule for 09/03/2015.  To f/u in 3 weeks for pre-op.    - Patient currently without pain, advised on OTC pain meds as needed.    Rubie Maid, MD [facility]

## 2015-08-15 ENCOUNTER — Encounter: Payer: Commercial Managed Care - PPO | Admitting: Obstetrics and Gynecology

## 2015-09-05 ENCOUNTER — Encounter: Payer: Self-pay | Admitting: Obstetrics and Gynecology

## 2015-09-05 ENCOUNTER — Ambulatory Visit (INDEPENDENT_AMBULATORY_CARE_PROVIDER_SITE_OTHER): Payer: Commercial Managed Care - PPO | Admitting: Obstetrics and Gynecology

## 2015-09-05 VITALS — BP 116/73 | HR 72 | Ht 65.0 in | Wt 248.4 lb

## 2015-09-05 DIAGNOSIS — R1033 Periumbilical pain: Secondary | ICD-10-CM

## 2015-09-05 DIAGNOSIS — N83201 Unspecified ovarian cyst, right side: Secondary | ICD-10-CM

## 2015-09-05 NOTE — Progress Notes (Signed)
      GYNECOLOGY PROGRESS NOTE  Subjective:    Patient ID: Shirley Garcia, female    DOB: 13-Feb-1976, 40 y.o.   MRN: [id number]  HPI  Patient is a 40 y.o. P41  female who presents for pre-operative evaluation.  Scheduled for laparoscopic right ovarian cystectomy for dermoid cyst and intermittent abdominal pain (mainly near umbilicus). Denies complaints today.   The following portions of the patient's history were reviewed and updated as appropriate: allergies, current medications, past family history, past medical history, past social history, past surgical history and problem list.  Review of Systems Pertinent items noted in HPI and remainder of comprehensive ROS otherwise negative.   Objective:   Blood pressure 116/73, pulse 72, height 5\' 5"  (1.651 m), weight 248 lb 6.4 oz (112.674 kg). General appearance: alert and no distress Abdomen: soft, non-tender; bowel sounds normal; no masses,  no organomegaly Pelvic: deferred Extremities: extremities normal, atraumatic, no cyanosis or edema Neurologic: Grossly normal   Assessment:   Right ovarian cyst (dermoid) Umbilical pain  Plan:   Patient desires surgical management with right ovarian cystectomy.  The risks of surgery were discussed in detail with the patient including but not limited to: bleeding which may require transfusion or reoperation; infection which may require prolonged hospitalization or re-hospitalization and antibiotic therapy; injury to bowel, bladder, ureters and major vessels or other surrounding organs; need for additional procedures including laparotomy; thromboembolic phenomenon, incisional problems and other postoperative or anesthesia complications.  Patient was told that the likelihood that her condition and symptoms will be treated effectively with this surgical management was very high; the postoperative expectations were also discussed in detail. The patient also understands the alternative treatment options  which were discussed in full. All questions were answered.  She was told that she will be contacted by our surgical scheduler regarding the time and date of her surgery; routine preoperative instructions of having nothing to eat or drink after midnight on the day prior to surgery and also coming to the hospital 1.5 hours prior to her time of surgery were also emphasized.  She was told she may be called for a preoperative appointment about a week prior to surgery and will be given further preoperative instructions at that visit. Printed patient education handouts about the procedure were given to the patient to review at home.  Scheduled for 09/17/2015.  Umbilical pain currently controlled with OTC pain meds.    A total of 15 minutes were spent face-to-face with the patient during this encounter and over half of that time dealt with counseling and coordination of care.  Rubie Maid, MD [facility]

## 2015-09-05 NOTE — H&P (Signed)
GYNECOLOGY CLINIC PROGRESS NOTE Subjective:    Patient is a 40 y.o. P61 female scheduled for laparoscopic right ovarian cystectomy. Indications for procedure are right dermoid cyst.   Pertinent Gynecological History: Menses: regular every 30 days without intermenstrual spotting Bleeding: no intermenstrual bleeding Contraception: none Last mammogram: not applicable  Last pap: normal Date:04/03/2015  Discussed Blood/Blood Products: no   Menstrual History: OB History    No data available      Menarche age:   No LMP recorded.    Past Medical History  Diagnosis Date  . Depression   . Ovarian cyst   . History of PCOS   . H/O cervical polypectomy 2008    Past Surgical History  Procedure Laterality Date  . Cervix polpys    . Cystectomy Bilateral 1996  . Cervical cerclage    . Cesarean section      x1    OB History  No data available    Social History   Social History  . Marital Status: Single    Spouse Name: N/A  . Number of Children: N/A  . Years of Education: N/A   Social History Main Topics  . Smoking status: Never Smoker   . Smokeless tobacco: None  . Alcohol Use: No  . Drug Use: No  . Sexual Activity: No   Other Topics Concern  . 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 Hypertension Mother   . Hypertension Sister   . Diabetes Maternal Grandfather      (Not in a hospital admission)  No Known Allergies  Review of Systems Constitutional: No recent fever/chills/sweats Respiratory: No recent cough/bronchitis Cardiovascular: No chest pain Gastrointestinal: No recent nausea/vomiting/diarrhea Genitourinary: No UTI symptoms Hematologic/lymphatic:No history of coagulopathy or recent blood thinner use    Objective:    BP 116/73 mmHg  Pulse 72  Ht 5\' 5"  (1.651 m)  Wt 248 lb 6.4 oz (112.674 kg)  BMI 41.34 kg/m2  General:   Normal  Skin:   normal  HEENT:   Normal  Neck:  Supple without Adenopathy or Thyromegaly  Lungs:   Heart:              Breasts:   Abdomen:  Pelvis:  M/S   Extremeties:  Neuro:    clear to auscultation bilaterally   Normal without murmur   Not Examined   soft, non-tender; bowel sounds normal; no masses,  no organomegaly   Exam deferred to OR  No CVAT  Warm/Dry   Normal           Imaging:  CT Scan 06/07/2015 FINDINGS: Lung bases are free of acute infiltrate or sizable effusion.  The liver, gallbladder, spleen, adrenal glands and pancreas are within normal limits. The kidneys are well visualized bilaterally and demonstrate a normal enhancement pattern. No calculi or obstructive changes are noted.  The appendix is air-filled and within normal limits. The bladder is well distended. The uterus is well visualized. Fluid is noted within the endometrial canal likely related the patient's current menstrual status.  Adjacent to the uterus to the right of the midline is a fat containing lesion which measures approximately 4.1 by 3.2 cm. A few small flecks of calcium are noted within. These changes likely represent an ovarian dermoid. The left ovary appears within normal limits. No free pelvic fluid is noted. No findings to suggest diverticulitis are seen.  The osseous structures are within normal limits.   IMPRESSION: Changes suggestive of an ovarian dermoid on the right.  No other focal abnormality is noted.  Assessment:    Right ovarian cyst (dermoid)   Plan:    Counseling: Procedure, risks, reasons, benefits and complications (including injury to bowel, bladder, major blood vessel, ureter, bleeding, possibility of transfusion, infection, or fistula formation) reviewed in detail.  Counseled on intention for cystectomy, however oophorectomy may be needed if disease present.  Preop testing ordered. Instructions reviewed, including NPO after midnight.    Rubie Maid, MD [facility]

## 2015-09-10 ENCOUNTER — Other Ambulatory Visit: Payer: Commercial Managed Care - PPO

## 2015-09-10 ENCOUNTER — Encounter: Payer: Self-pay | Admitting: *Deleted

## 2015-09-10 NOTE — Patient Instructions (Signed)
  Your procedure is scheduled on: 09-17-15 [facility]) Report to [place] To find out your arrival time please call [phone] between 1PM - 3PM on 09-14-15 (FRIDAY)  Remember: Instructions that are not followed completely may result in serious medical risk, up to and including death, or upon the discretion of your surgeon and anesthesiologist your surgery may need to be rescheduled.    _X__ 1. Do not eat food or drink liquids after midnight. No gum chewing or hard candies.     _X__ 2. No Alcohol for 24 hours before or after surgery.   ____ 3. Bring all medications with you on the day of surgery if instructed.    _X__ 4. Notify your doctor if there is any change in your medical condition     (cold, fever, infections).     Do not wear jewelry, make-up, hairpins, clips or nail polish.  Do not wear lotions, powders, or perfumes. You may wear deodorant.  Do not shave 48 hours prior to surgery. Men may shave face and neck.  Do not bring valuables to the hospital.    [facility] is not responsible for any belongings or valuables.               Contacts, dentures or bridgework may not be worn into surgery.  Leave your suitcase in the car. After surgery it may be brought to your room.  For patients admitted to the hospital, discharge time is determined by your treatment team.   Patients discharged the day of surgery will not be allowed to drive home.   Please read over the following fact sheets that you were given:     ____ Take these medicines the morning of surgery with A SIP OF WATER:    1. NONE  2.   3.   4.  5.  6.  ____ Fleet Enema (as directed)   _X__ Use CHG Soap as directed  ____ Use inhalers on the day of surgery  ____ Stop metformin 2 days prior to surgery    ____ Take 1/2 of usual insulin dose the night before surgery and none on the morning of surgery.   ____ Stop Coumadin/Plavix/aspirin-N/A  _X__ Stop Anti-inflammatories-STOP  IBUPROFEN NOW-NO NSAIDS OR ASPIRIN PRODUCTS-TYLENOL OK TO TAKE   ____ Stop supplements until after surgery.    ____ Bring C-Pap to the hospital.

## 2015-09-11 ENCOUNTER — Encounter
Admission: RE | Admit: 2015-09-11 | Discharge: 2015-09-11 | Disposition: A | Discharge status: Home / Self Care | Payer: Commercial Managed Care - PPO | Source: Ambulatory Visit | Attending: Obstetrics and Gynecology | Admitting: Obstetrics and Gynecology

## 2015-09-11 DIAGNOSIS — Z01812 Encounter for preprocedural laboratory examination: Secondary | ICD-10-CM | POA: Insufficient documentation

## 2015-09-11 DIAGNOSIS — D270 Benign neoplasm of right ovary: Secondary | ICD-10-CM | POA: Insufficient documentation

## 2015-09-11 LAB — TYPE AND SCREEN
ABO/RH(D): O POS
Antibody Screen: NEGATIVE

## 2015-09-11 LAB — CBC
HEMATOCRIT: 39.3 % (ref 35.0–47.0)
HEMOGLOBIN: 12.9 g/dL (ref 12.0–16.0)
MCH: 28.9 pg (ref 26.0–34.0)
MCHC: 33 g/dL (ref 32.0–36.0)
MCV: 87.7 fL (ref 80.0–100.0)
Platelets: 290 10*3/uL (ref 150–440)
RBC: 4.48 MIL/uL (ref 3.80–5.20)
RDW: 13.7 % (ref 11.5–14.5)
WBC: 5.9 10*3/uL (ref 3.6–11.0)

## 2015-09-11 LAB — ABO/RH: ABO/RH(D): O POS

## 2015-09-13 ENCOUNTER — Encounter: Payer: Self-pay | Admitting: Obstetrics and Gynecology

## 2015-09-14 NOTE — Pre-Procedure Instructions (Signed)
Called dr cherry's office regarding ordering for Pepcid IV that she puts in Epic for her pre-op orders-Robin went and talked to Dr Marcelline Mates and she said to discontinue order

## 2015-09-17 ENCOUNTER — Telehealth: Payer: Self-pay | Admitting: Obstetrics and Gynecology

## 2015-09-17 ENCOUNTER — Ambulatory Visit: Payer: Commercial Managed Care - PPO | Admitting: Anesthesiology

## 2015-09-17 ENCOUNTER — Ambulatory Visit
Admission: RE | Admit: 2015-09-17 | Discharge: 2015-09-17 | Disposition: A | Discharge status: Home / Self Care | Payer: Commercial Managed Care - PPO | Source: Ambulatory Visit | Attending: Obstetrics and Gynecology | Admitting: Obstetrics and Gynecology

## 2015-09-17 ENCOUNTER — Encounter
Admission: RE | Disposition: A | Discharge status: Home / Self Care | Payer: Self-pay | Source: Ambulatory Visit | Attending: Obstetrics and Gynecology

## 2015-09-17 DIAGNOSIS — R1033 Periumbilical pain: Secondary | ICD-10-CM | POA: Diagnosis not present

## 2015-09-17 DIAGNOSIS — Z8249 Family history of ischemic heart disease and other diseases of the circulatory system: Secondary | ICD-10-CM | POA: Insufficient documentation

## 2015-09-17 DIAGNOSIS — Z833 Family history of diabetes mellitus: Secondary | ICD-10-CM | POA: Diagnosis not present

## 2015-09-17 DIAGNOSIS — Z6841 Body Mass Index (BMI) 40.0 and over, adult: Secondary | ICD-10-CM | POA: Insufficient documentation

## 2015-09-17 DIAGNOSIS — R102 Pelvic and perineal pain: Secondary | ICD-10-CM | POA: Diagnosis present

## 2015-09-17 DIAGNOSIS — N736 Female pelvic peritoneal adhesions (postinfective): Secondary | ICD-10-CM | POA: Diagnosis not present

## 2015-09-17 DIAGNOSIS — F329 Major depressive disorder, single episode, unspecified: Secondary | ICD-10-CM | POA: Diagnosis not present

## 2015-09-17 DIAGNOSIS — D270 Benign neoplasm of right ovary: Secondary | ICD-10-CM | POA: Diagnosis not present

## 2015-09-17 DIAGNOSIS — N83201 Unspecified ovarian cyst, right side: Secondary | ICD-10-CM

## 2015-09-17 HISTORY — PX: LAPAROSCOPIC SALPINGO OOPHERECTOMY: SHX5927

## 2015-09-17 HISTORY — DX: Polycystic ovarian syndrome: E28.2

## 2015-09-17 HISTORY — PX: LAPAROSCOPIC LYSIS OF ADHESIONS: SHX5905

## 2015-09-17 LAB — POCT PREGNANCY, URINE: PREG TEST UR: NEGATIVE

## 2015-09-17 SURGERY — SALPINGO-OOPHORECTOMY, LAPAROSCOPIC
Anesthesia: General | Laterality: Right | Wound class: Clean Contaminated

## 2015-09-17 MED ORDER — FAMOTIDINE 20 MG PO TABS
ORAL_TABLET | ORAL | Status: AC
  Administered 2015-09-17: 20 mg via ORAL
  Filled 2015-09-17: qty 1

## 2015-09-17 MED ORDER — KETOROLAC TROMETHAMINE 30 MG/ML IJ SOLN
INTRAMUSCULAR | Status: DC | PRN
  Administered 2015-09-17: 30 mg via INTRAVENOUS

## 2015-09-17 MED ORDER — FENTANYL CITRATE (PF) 100 MCG/2ML IJ SOLN
INTRAMUSCULAR | Status: DC | PRN
  Administered 2015-09-17 (×2): 100 ug via INTRAVENOUS

## 2015-09-17 MED ORDER — LACTATED RINGERS IV SOLN: INTRAVENOUS | Status: DC

## 2015-09-17 MED ORDER — OXYCODONE HCL 5 MG PO TABS: 5.0000 mg | ORAL_TABLET | Freq: Once | ORAL | Status: DC | PRN

## 2015-09-17 MED ORDER — GLYCOPYRROLATE 0.2 MG/ML IJ SOLN
INTRAMUSCULAR | Status: DC | PRN
  Administered 2015-09-17: 0.6 mg via INTRAVENOUS

## 2015-09-17 MED ORDER — IBUPROFEN 800 MG PO TABS: 800.0000 mg | ORAL_TABLET | Freq: Three times a day (TID) | ORAL | Status: DC | PRN

## 2015-09-17 MED ORDER — PROPOFOL 10 MG/ML IV BOLUS
INTRAVENOUS | Status: DC | PRN
  Administered 2015-09-17: 200 mg via INTRAVENOUS

## 2015-09-17 MED ORDER — BUPIVACAINE HCL 0.5 % IJ SOLN
INTRAMUSCULAR | Status: DC | PRN
  Administered 2015-09-17: 24 mL

## 2015-09-17 MED ORDER — DEXAMETHASONE SODIUM PHOSPHATE 10 MG/ML IJ SOLN
INTRAMUSCULAR | Status: DC | PRN
  Administered 2015-09-17: 10 mg via INTRAVENOUS

## 2015-09-17 MED ORDER — SIMETHICONE 80 MG PO CHEW: 80.0000 mg | CHEWABLE_TABLET | Freq: Four times a day (QID) | ORAL | Status: DC | PRN

## 2015-09-17 MED ORDER — LIDOCAINE HCL (CARDIAC) 20 MG/ML IV SOLN
INTRAVENOUS | Status: DC | PRN
  Administered 2015-09-17: 100 mg via INTRAVENOUS

## 2015-09-17 MED ORDER — DOCUSATE SODIUM 100 MG PO CAPS: 100.0000 mg | ORAL_CAPSULE | Freq: Two times a day (BID) | ORAL | Status: DC | PRN

## 2015-09-17 MED ORDER — FENTANYL CITRATE (PF) 100 MCG/2ML IJ SOLN
25.0000 ug | INTRAMUSCULAR | Status: DC | PRN
  Administered 2015-09-17: 25 ug via INTRAVENOUS
  Administered 2015-09-17: 50 ug via INTRAVENOUS
  Administered 2015-09-17: 25 ug via INTRAVENOUS

## 2015-09-17 MED ORDER — FENTANYL CITRATE (PF) 100 MCG/2ML IJ SOLN
INTRAMUSCULAR | Status: AC
  Filled 2015-09-17: qty 2

## 2015-09-17 MED ORDER — NEOSTIGMINE METHYLSULFATE 10 MG/10ML IV SOLN
INTRAVENOUS | Status: DC | PRN
  Administered 2015-09-17: 3 mg via INTRAVENOUS

## 2015-09-17 MED ORDER — FAMOTIDINE 20 MG PO TABS
20.0000 mg | ORAL_TABLET | Freq: Once | ORAL | Status: AC
  Administered 2015-09-17: 20 mg via ORAL

## 2015-09-17 MED ORDER — LACTATED RINGERS IV SOLN
INTRAVENOUS | Status: DC
  Administered 2015-09-17 (×3): via INTRAVENOUS

## 2015-09-17 MED ORDER — OXYCODONE-ACETAMINOPHEN 5-325 MG PO TABS: 1.0000 | ORAL_TABLET | Freq: Four times a day (QID) | ORAL | Status: DC | PRN

## 2015-09-17 MED ORDER — MIDAZOLAM HCL 2 MG/2ML IJ SOLN
INTRAMUSCULAR | Status: DC | PRN
  Administered 2015-09-17: 2 mg via INTRAVENOUS

## 2015-09-17 MED ORDER — ONDANSETRON HCL 4 MG/2ML IJ SOLN
INTRAMUSCULAR | Status: DC | PRN
  Administered 2015-09-17: 4 mg via INTRAVENOUS

## 2015-09-17 MED ORDER — OXYCODONE HCL 5 MG/5ML PO SOLN: 5.0000 mg | Freq: Once | ORAL | Status: DC | PRN

## 2015-09-17 MED ORDER — ROCURONIUM BROMIDE 100 MG/10ML IV SOLN
INTRAVENOUS | Status: DC | PRN
  Administered 2015-09-17: 10 mg via INTRAVENOUS
  Administered 2015-09-17: 40 mg via INTRAVENOUS

## 2015-09-17 MED ORDER — ESMOLOL HCL 100 MG/10ML IV SOLN
INTRAVENOUS | Status: DC | PRN
  Administered 2015-09-17: 30 mg via INTRAVENOUS

## 2015-09-17 MED ORDER — BUPIVACAINE HCL (PF) 0.5 % IJ SOLN
INTRAMUSCULAR | Status: AC
  Filled 2015-09-17: qty 30

## 2015-09-17 SURGICAL SUPPLY — 35 items
BLADE SURG SZ11 CARB STEEL (BLADE) ×3 IMPLANT
CANISTER SUCT 1200ML W/VALVE (MISCELLANEOUS) ×3 IMPLANT
CATH ROBINSON RED A/P 16FR (CATHETERS) ×3 IMPLANT
CHLORAPREP W/TINT 26ML (MISCELLANEOUS) ×3 IMPLANT
CORD MONOPOLAR M/FML 12FT (MISCELLANEOUS) ×3 IMPLANT
DRSG TEGADERM 2-3/8X2-3/4 SM (GAUZE/BANDAGES/DRESSINGS) ×9 IMPLANT
GLOVE BIO SURGEON STRL SZ 6 (GLOVE) ×12 IMPLANT
GLOVE BIOGEL PI IND STRL 6.5 (GLOVE) ×2 IMPLANT
GLOVE BIOGEL PI INDICATOR 6.5 (GLOVE) ×1
GOWN STRL REUS W/ TWL LRG LVL3 (GOWN DISPOSABLE) ×4 IMPLANT
GOWN STRL REUS W/TWL LRG LVL3 (GOWN DISPOSABLE) ×2
IRRIGATION STRYKERFLOW (MISCELLANEOUS) IMPLANT
IRRIGATOR STRYKERFLOW (MISCELLANEOUS)
IV LACTATED RINGERS 1000ML (IV SOLUTION) ×3 IMPLANT
KIT RM TURNOVER CYSTO AR (KITS) ×3 IMPLANT
LABEL OR SOLS (LABEL) ×3 IMPLANT
LIQUID BAND (GAUZE/BANDAGES/DRESSINGS) ×3 IMPLANT
NS IRRIG 1000ML POUR BTL (IV SOLUTION) ×3 IMPLANT
NS IRRIG 500ML POUR BTL (IV SOLUTION) ×3 IMPLANT
PACK GYN LAPAROSCOPIC (MISCELLANEOUS) ×3 IMPLANT
PAD OB MATERNITY 4.3X12.25 (PERSONAL CARE ITEMS) ×3 IMPLANT
PAD PREP 24X41 OB/GYN DISP (PERSONAL CARE ITEMS) ×3 IMPLANT
POUCH ENDO CATCH 10MM SPEC (MISCELLANEOUS) ×3 IMPLANT
SCISSORS METZENBAUM CVD 33 (INSTRUMENTS) ×3 IMPLANT
SHEARS HARMONIC ACE PLUS 36CM (ENDOMECHANICALS) ×3 IMPLANT
SLEEVE ENDOPATH XCEL 5M (ENDOMECHANICALS) ×3 IMPLANT
SUT ETHILON NAB PS2 4-0 18IN (SUTURE) ×6 IMPLANT
SUT VIC AB 0 CT1 36 (SUTURE) ×3 IMPLANT
SUT VIC AB 3-0 SH 27 (SUTURE) ×1
SUT VIC AB 3-0 SH 27X BRD (SUTURE) ×2 IMPLANT
SUT VICRYL 0 AB UR-6 (SUTURE) ×3 IMPLANT
TROCAR ENDO BLADELESS 11MM (ENDOMECHANICALS) ×3 IMPLANT
TROCAR XCEL NON-BLD 5MMX100MML (ENDOMECHANICALS) ×3 IMPLANT
TROCAR XCEL UNIV SLVE 11M 100M (ENDOMECHANICALS) IMPLANT
TUBING INSUFFLATOR HI FLOW (MISCELLANEOUS) ×3 IMPLANT

## 2015-09-17 NOTE — Op Note (Signed)
Procedure(s): LAPAROSCOPIC SALPINGO OOPHORECTOMY LAPAROSCOPIC LYSIS OF ADHESIONS Procedure Note  HARMONIE WONDRA female 40 y.o. 09/17/2015  Indications: The patient is a 40 y.o. P73 female with right ovarian cyst (dermoid), mild abdominal/pelvic pain  Pre-operative Diagnosis:  right ovarian cyst (dermoid), mild abdominal/pelvic pain, morbid obesity  Post-operative Diagnosis: Right ovarian cyst (dermoid), pelvic adhesive disease, morbid obesity  Surgeon: Rubie Maid, MD  Assistants: Malachi Paradise, MD  Anesthesia: General endotracheal anesthesia  ASA Class: III  Procedure Details: The patient was seen in the Holding Room. The risks, benefits, complications, treatment options, and expected outcomes were discussed with the patient.  The patient concurred with the proposed plan, giving informed consent.  The site of surgery properly noted/marked. The patient was taken to the Operating Room, identified as Vanesa T Auzenne and the procedure verified as Procedure(s) (LRB): LAPAROSCOPIC SALPINGO OOPHORECTOMY (Right) LAPAROSCOPIC LYSIS OF ADHESIONS. A Time Out was held and the above information confirmed.  She was then placed under general anesthesia without difficulty. She was placed in the dorsal lithotomy position, and was prepped and draped in a sterile manner.  A straight catheterization was performed. A sterile speculum was inserted into the vagina and the cervix was grasped at the anterior lip using a single-toothed tenaculum.  The uterus was sounded and a Hulka clamp was placed for uterine manipulation.  The speculum and tenaculum were then removed. After an adequate timeout was performed, attention was turned to the abdomen where an umbilical incision was made with the scalpel.  The Optiview 5-mm trocar and sleeve were then advanced without difficulty with the laparoscope under direct visualization into the abdomen.  The abdomen was then insufflated with carbon dioxide gas and  adequate pneumoperitoneum was obtained. A 5-mm left lower quadrant port and an 11-mm right lower quadrant port were then placed under direct visualization.  A survey of the patient's pelvis and abdomen revealed the findings as noted below.  No identifiable plane could be noted between dermoid cyst and normal ovarian tissue.  The decision was made to proceed with right salpingo-oophorectomy. The right ureter was identified with good peristalsis. The right infundibulopelvic ligament was clamped and transected with the Harmonic device.  An Endocatch bag was then inserted into the 11 mm trochar and the left ovary and tube were inserted.  The skin and fascial incision were extended to be able to remove the specimen. The fascia of the incision was then grasped with Kocher clamps and approximated at each edge using a 0-Vicryl suture.  The pneumoperitoneum was then re-established, and the laparoscope was then introduced once again into the abdominal cavity. Attention was then turned to the left adnexa which was noted to be adherent to the posterior surface of the uterus and left side wall. Adhesiolysis was performed using the Harmonic scalpel device.  The adhesions of the uterus to the anterior abdominal wall were also lysed using the Harmonic scalpel.  Good hemostasis was noted throughout the procedure.    The cul-de-sac was irrigated with approximately 20 cc of saline, using a blunt tip aspirator with a 30 cc syringe. A final survey was performed, where good hemostasis was continued to be observed.  Good hemostasis was still noted.  All trocars were removed under direct visualization, and the abdomen which was desufflated.    A suture of 0-Vicryl was used to close the right fascial incision in a running fashion.  The subcutaneous tissue layer was closed using 3-0 Vicryl in a running fashion.  The skin was closed using  4-0 vicryl in a subcuticular fashion.  All other skin incisions were closed with 4-0 Monocryl using  interrupted stitches. The patient tolerated the procedures well.  All instruments, needles, and sponge counts were correct x 2. The patient was taken to the recovery room awake, extubated and in stable condition.   Findings: The uterus was sounded to 8 cm. The left ovary  Was noted to contain a dermoid cyst with small dark flecks, unable to identify plane between ovary and cyst. Left fallopian tube appeared edematous. Left adnexa with thick adhesions to posterior surface of the uterus.  Right fallopian tube appeared normal.  Right ovary with moderate amount of filmy adhesions to the posterior surface and left pelvic sidewall.   Otherwise normal appearing ovary.   The anterior surface of the uterus with thick adhesion band to anterior abdominal wall.  Also with adhesions to the bladder.  Posterior cul-de-sac with peritoneal window, few filmy adhesions present.   Estimated Blood Loss:  Minimal (~ 15 cc)      Drains: straight catheterization prior to procedure with 50 ml of clear urine         Total IV Fluids:  1000 ml  Specimens: Right fallopian tube and ovary         Implants: None         Complications:  None; patient tolerated the procedure well.         Disposition: PACU - hemodynamically stable.         Condition: stable   Rubie Maid, MD [facility]

## 2015-09-17 NOTE — Anesthesia Postprocedure Evaluation (Signed)
Anesthesia Post Note  Patient: Shirley Garcia  Procedure(s) Performed: Procedure(s) (LRB): LAPAROSCOPIC SALPINGO OOPHORECTOMY (Right) LAPAROSCOPIC LYSIS OF ADHESIONS  Patient location during evaluation: PACU Anesthesia Type: General Level of consciousness: awake and alert Pain management: pain level controlled Vital Signs Assessment: post-procedure vital signs reviewed and stable Respiratory status: spontaneous breathing, nonlabored ventilation, respiratory function stable and patient connected to nasal cannula oxygen Cardiovascular status: blood pressure returned to baseline and stable Postop Assessment: no signs of nausea or vomiting Anesthetic complications: no    Last Vitals:  Filed Vitals:   09/17/15 1017 09/17/15 1029  BP:  116/62  Pulse: 70 67  Temp:  36.4 C  Resp:  16    Last Pain:  Filed Vitals:   09/17/15 1031  PainSc: 6                  Joseph K Piscitello

## 2015-09-17 NOTE — Telephone Encounter (Signed)
PT HAD SURGERY TODAY AT [facility]. HER INCISION IS BLEEDING AND THE NURSES DIDN'T TELL HER HOW TO CLEAN IT OR WHAT TO CLEAN IT WITH. PLEASE CALL.

## 2015-09-17 NOTE — Transfer of Care (Signed)
Immediate Anesthesia Transfer of Care Note  Patient: Shirley Garcia  Procedure(s) Performed: Procedure(s): LAPAROSCOPIC SALPINGO OOPHORECTOMY (Right) LAPAROSCOPIC LYSIS OF ADHESIONS  Patient Location: PACU  Anesthesia Type:General  Level of Consciousness: awake  Airway & Oxygen Therapy: Patient Spontanous Breathing  Post-op Assessment: Report given to RN  Post vital signs: stable  Last Vitals:  Filed Vitals:   09/17/15 0604  BP: 128/78  Pulse: 83  Temp: 37.1 C  Resp: 16    Complications: No apparent anesthesia complications

## 2015-09-17 NOTE — Telephone Encounter (Signed)
Pt is s/p rt tube and ovary. Pt aware the bandages will fall off on there on. A small amount of blood is ok. If incision is oozing blood, has a foul odor, redness around or pain not controlled with pain meds she will need to be seen asap. She can take shower. Pt to f/u with ac 1 week.

## 2015-09-17 NOTE — Discharge Instructions (Signed)
General Gynecological Post-Operative Instructions You may expect to feel dizzy, weak, and drowsy for as long as 24 hours after receiving the medicine that made you sleep (anesthetic).  Do not drive a car, ride a bicycle, participate in physical activities, or take public transportation until you are done taking narcotic pain medicines or as directed by your doctor.  Do not drink alcohol or take tranquilizers.  Do not take medicine that has not been prescribed by your doctor.  Do not sign important papers or make important decisions while on narcotic pain medicines.  Have a responsible person with you.  CARE OF INCISION  Keep incision clean and dry. Take showers instead of baths until your doctor gives you permission to take baths.  Avoid heavy lifting (more than 10 pounds/4.5 kilograms), pushing, or pulling.  Avoid activities that may risk injury to your surgical site.  No sexual intercourse or placement of anything in the vagina for 2 weeks or as instructed by your doctor. Only take prescription or over-the-counter medicines  for pain, discomfort, or fever as directed by your doctor. Do not take aspirin. It can make you bleed. Take medicines (antibiotics) that kill germs if they are prescribed for you.  Call the office or go to the Emergency Room if:  You feel sick to your stomach (nauseous).  You start to throw up (vomit).  You have trouble eating or drinking.  You have an oral temperature above 101.  You have constipation that is not helped by adjusting diet or increasing fluid intake. Pain medicines are a common cause of constipation.  You have any other concerns. SEEK IMMEDIATE MEDICAL CARE IF:  You have persistent dizziness.  You have difficulty breathing or a congested sounding (croupy) cough.  You have an oral temperature above 102.5, not controlled by medicine.  There is increasing pain or tenderness near or in the surgical site.

## 2015-09-17 NOTE — H&P (Signed)
UPDATE TO PREVIOUS HISTORY AND PHYSICAL  The patient has been seen and examined.  H&P is up to date, no changes noted. Patient is a 40 y.o. P42 female scheduled for laparoscopic right ovarian cystectomy. Indications for procedure are right dermoid cyst, mild right pelvic pain. Understands the possibility of right oophorectomy. Has previously been counseled on risks/complications of procedure. Patient can proceed to the OR for scheduled procedure.   Rubie Maid, MD 09/17/2015 7:20 AM

## 2015-09-17 NOTE — Anesthesia Preprocedure Evaluation (Signed)
Anesthesia Evaluation  Patient identified by MRN, date of birth, ID band Patient awake    Reviewed: Allergy & Precautions, H&P , NPO status , Patient's Chart, lab work & pertinent test results  History of Anesthesia Complications Negative for: history of anesthetic complications  Airway Mallampati: II  TM Distance: >3 FB Neck ROM: full    Dental no notable dental hx. (+) Teeth Intact   Pulmonary neg pulmonary ROS, neg shortness of breath,    Pulmonary exam normal breath sounds clear to auscultation       Cardiovascular Exercise Tolerance: Good (-) angina(-) Past MI and (-) DOE negative cardio ROS Normal cardiovascular exam Rhythm:regular Rate:Normal     Neuro/Psych PSYCHIATRIC DISORDERS Depression negative neurological ROS     GI/Hepatic negative GI ROS, Neg liver ROS,   Endo/Other  Morbid obesity  Renal/GU negative Renal ROS  negative genitourinary   Musculoskeletal   Abdominal   Peds  Hematology negative hematology ROS (+)   Anesthesia Other Findings Past Medical History:   Depression                                                   Ovarian cyst                                                 History of PCOS                                              H/O cervical polypectomy                        2008         PCOS (polycystic ovarian syndrome)                          Past Surgical History:   cervix polpys                                                 CYSTECTOMY                                      Bilateral 1996         CERVICAL CERCLAGE                                             CESAREAN SECTION                                                Comment:x1  BMI    Body Mass Index   41.26 kg/m 2  Reproductive/Obstetrics negative OB ROS                             Anesthesia Physical Anesthesia Plan  ASA: III  Anesthesia Plan: General ETT   Post-op Pain  Management:    Induction:   Airway Management Planned:   Additional Equipment:   Intra-op Plan:   Post-operative Plan:   Informed Consent: I have reviewed the patients History and Physical, chart, labs and discussed the procedure including the risks, benefits and alternatives for the proposed anesthesia with the patient or authorized representative who has indicated his/her understanding and acceptance.   Dental Advisory Given  Plan Discussed with: Anesthesiologist, CRNA and Surgeon  Anesthesia Plan Comments:         Anesthesia Quick Evaluation

## 2015-09-17 NOTE — Anesthesia Procedure Notes (Signed)
Procedure Name: Intubation Date/Time: 09/17/2015 7:36 AM Performed by: Leander Rams Pre-anesthesia Checklist: Patient identified Patient Re-evaluated:Patient Re-evaluated prior to inductionOxygen Delivery Method: Circle system utilized Preoxygenation: Pre-oxygenation with 100% oxygen Intubation Type: IV induction Ventilation: Mask ventilation without difficulty Laryngoscope Size: Mac and 3 Grade View: Grade I Tube type: Oral Tube size: 7.0 mm Number of attempts: 1 Airway Equipment and Method: Stylet Placement Confirmation: ETT inserted through vocal cords under direct vision,  positive ETCO2 and breath sounds checked- equal and bilateral Secured at: 23 cm Tube secured with: Tape Dental Injury: Teeth and Oropharynx as per pre-operative assessment

## 2015-09-18 LAB — SURGICAL PATHOLOGY

## 2015-09-26 ENCOUNTER — Other Ambulatory Visit: Payer: Self-pay | Admitting: Obstetrics and Gynecology

## 2015-09-27 ENCOUNTER — Encounter: Payer: Self-pay | Admitting: Obstetrics and Gynecology

## 2015-09-27 ENCOUNTER — Ambulatory Visit (INDEPENDENT_AMBULATORY_CARE_PROVIDER_SITE_OTHER): Payer: Commercial Managed Care - PPO | Admitting: Obstetrics and Gynecology

## 2015-09-27 VITALS — BP 132/82 | HR 82 | Ht 65.0 in | Wt 248.3 lb

## 2015-09-27 DIAGNOSIS — Z9889 Other specified postprocedural states: Secondary | ICD-10-CM

## 2015-09-27 DIAGNOSIS — Z90721 Acquired absence of ovaries, unilateral: Secondary | ICD-10-CM

## 2015-09-27 DIAGNOSIS — G8918 Other acute postprocedural pain: Secondary | ICD-10-CM

## 2015-09-27 MED ORDER — OXYCODONE-ACETAMINOPHEN 5-325 MG PO TABS: 1.0000 | ORAL_TABLET | Freq: Four times a day (QID) | ORAL | Status: DC | PRN

## 2015-09-28 NOTE — Progress Notes (Signed)
   GYNECOLOGY POST-OPERATIVE CLINIC PROGRESS NOTE  Subjective:     Shirley Garcia is a 40 y.o. female who presents to the clinic 10 days status post laparoscopic left oophorectomy and lysis of adhesions for adnexal mass and pelvic adhesive disease. Eating a regular diet without difficulty. Bowel movements are normal. Pain is controlled with current analgesics. Medications being used: prescription NSAID's including ibuprofen (Motrin) and narcotic analgesics including Percocet. Patient notes that she has run out of Percocet and desires refill as she is still noting moderate pain on left side.  The following portions of the patient's history were reviewed and updated as appropriate: allergies, current medications, past family history, past medical history, past social history, past surgical history and problem list.  Review of Systems Pertinent items noted in HPI and remainder of comprehensive ROS otherwise negative.    Objective:    BP 132/82 mmHg  Pulse 82  Ht 5\' 5"  (1.651 m)  Wt 248 lb 4.8 oz (112.628 kg)  BMI 41.32 kg/m2 General:  alert and no distress  Abdomen: soft, bowel sounds active, non-tender  Incisions:   healing well, no drainage, no erythema, no hernia, no seroma, no swelling, no dehiscence, incision well approximated        Pathology (09/17/2015):  RIGHT OVARY AND FALLOPIAN TUBE; RIGHT SALPINGO-OOPHORECTOMY:  - MATURE CYSTIC TERATOMA.  - FALLOPIAN TUBE WITH VASCULAR CONGESTION.   Assessment:   Postoperative course complicated by continued pain on left side at incision site S/p laparoscopic left oophorectomy   Plan:    1. Continue any current medications. Refill given on Percocet.  2. Wound care discussed. 3. Activity restrictions: no bending, stooping, or squatting and no lifting more than 15 pounds  4. Operative findings again reviewed. Pathology report discussed. 5. Anticipated return to work: work Quarry manager provided and return in 1 additional week. Patient has a  job involving heavy lifting, does not feel that she is ready to return to work due to persistent pain on left.  4. Follow up: as needed or if symptoms worsen.

## 2015-11-01 ENCOUNTER — Encounter: Payer: Self-pay | Admitting: Family Medicine

## 2015-11-07 ENCOUNTER — Ambulatory Visit: Payer: Commercial Managed Care - PPO | Admitting: Family Medicine

## 2016-04-29 ENCOUNTER — Encounter: Payer: Self-pay | Admitting: Family Medicine

## 2016-04-29 ENCOUNTER — Encounter: Payer: Self-pay | Admitting: *Deleted

## 2016-04-29 ENCOUNTER — Ambulatory Visit (INDEPENDENT_AMBULATORY_CARE_PROVIDER_SITE_OTHER): Payer: Commercial Managed Care - PPO | Admitting: Family Medicine

## 2016-04-29 VITALS — BP 124/80 | HR 101 | Temp 98.1°F | Ht 64.0 in | Wt 249.8 lb

## 2016-04-29 DIAGNOSIS — Z0000 Encounter for general adult medical examination without abnormal findings: Secondary | ICD-10-CM | POA: Diagnosis not present

## 2016-04-29 DIAGNOSIS — Z01419 Encounter for gynecological examination (general) (routine) without abnormal findings: Secondary | ICD-10-CM

## 2016-04-29 LAB — CBC WITH DIFFERENTIAL/PLATELET
BASOS ABS: 0.1 10*3/uL (ref 0.0–0.1)
Basophils Relative: 0.9 % (ref 0.0–3.0)
Eosinophils Absolute: 0.6 10*3/uL (ref 0.0–0.7)
Eosinophils Relative: 9.9 % — ABNORMAL HIGH (ref 0.0–5.0)
HCT: 37.3 % (ref 36.0–46.0)
Hemoglobin: 12.5 g/dL (ref 12.0–15.0)
LYMPHS ABS: 2.8 10*3/uL (ref 0.7–4.0)
Lymphocytes Relative: 47 % — ABNORMAL HIGH (ref 12.0–46.0)
MCHC: 33.5 g/dL (ref 30.0–36.0)
MCV: 88.7 fl (ref 78.0–100.0)
MONOS PCT: 6.6 % (ref 3.0–12.0)
Monocytes Absolute: 0.4 10*3/uL (ref 0.1–1.0)
NEUTROS PCT: 35.6 % — AB (ref 43.0–77.0)
Neutro Abs: 2.1 10*3/uL (ref 1.4–7.7)
Platelets: 345 10*3/uL (ref 150.0–400.0)
RBC: 4.21 Mil/uL (ref 3.87–5.11)
RDW: 13.9 % (ref 11.5–15.5)
WBC: 6 10*3/uL (ref 4.0–10.5)

## 2016-04-29 LAB — LIPID PANEL
Cholesterol: 113 mg/dL (ref 0–200)
HDL: 59.8 mg/dL (ref >39.00)
LDL Cholesterol: 40 mg/dL (ref 0–99)
NONHDL: 53.51
Total CHOL/HDL Ratio: 2
Triglycerides: 66 mg/dL (ref 0.0–149.0)
VLDL: 13.2 mg/dL (ref 0.0–40.0)

## 2016-04-29 LAB — COMPREHENSIVE METABOLIC PANEL
ALK PHOS: 58 U/L (ref 39–117)
ALT: 17 U/L (ref 0–35)
AST: 15 U/L (ref 0–37)
Albumin: 4.1 g/dL (ref 3.5–5.2)
BILIRUBIN TOTAL: 0.3 mg/dL (ref 0.2–1.2)
BUN: 13 mg/dL (ref 6–23)
CO2: 28 mEq/L (ref 19–32)
Calcium: 9 mg/dL (ref 8.4–10.5)
Chloride: 106 mEq/L (ref 96–112)
Creatinine, Ser: 0.67 mg/dL (ref 0.40–1.20)
GFR: 125.84 mL/min (ref >60.00)
GLUCOSE: 104 mg/dL — AB (ref 70–99)
Potassium: 3.6 mEq/L (ref 3.5–5.1)
SODIUM: 140 meq/L (ref 135–145)
TOTAL PROTEIN: 7.1 g/dL (ref 6.0–8.3)

## 2016-04-29 LAB — TSH: TSH: 0.75 u[IU]/mL (ref 0.35–4.50)

## 2016-04-29 NOTE — Progress Notes (Signed)
Subjective:    Patient ID: Shirley Garcia, female    DOB: 24-Dec-1975, 40 y.o.   MRN: [id number]  HPI  40 yo G2P2 here for CPX and follow up of chronic medical conditions.  No h/o abnormal pap smears.  Last pap smear was 04/03/15- done by me. No known family history of cervical or ovarian CA. Great aunt had breast CA.     Wt Readings from Last 3 Encounters:  04/29/16 249 lb 12 oz (113.3 kg)  09/27/15 248 lb 4.8 oz (112.6 kg)  09/17/15 248 lb (112.5 kg)     Lab Results  Component Value Date   WBC 5.9 09/11/2015   HGB 12.9 09/11/2015   HCT 39.3 09/11/2015   MCV 87.7 09/11/2015   PLT 290 09/11/2015    Lab Results  Component Value Date   CREATININE 0.64 04/03/2015   Lab Results  Component Value Date   CHOL 124 04/03/2015   HDL 62.30 04/03/2015   LDLCALC 45 04/03/2015   TRIG 81.0 04/03/2015   CHOLHDL 2 04/03/2015   Lab Results  Component Value Date   ALT 16 04/03/2015   AST 15 04/03/2015   ALKPHOS 58 04/03/2015   BILITOT 0.4 04/03/2015   Lab Results  Component Value Date   TSH 1.15 04/03/2015   Lab Results  Component Value Date   WBC 5.9 09/11/2015   HGB 12.9 09/11/2015   HCT 39.3 09/11/2015   MCV 87.7 09/11/2015   PLT 290 09/11/2015     Patient Active Problem List  Diagnosis  . Obesity  . Well woman exam   Past Medical History:  Diagnosis Date  . Depression   . H/O cervical polypectomy 2008  . History of PCOS   . Ovarian cyst   . PCOS (polycystic ovarian syndrome)    Past Surgical History:  Procedure Laterality Date  . CERVICAL CERCLAGE    . cervix polpys    . CESAREAN SECTION     x1  . CYSTECTOMY Bilateral 1996  . LAPAROSCOPIC LYSIS OF ADHESIONS  09/17/2015   Procedure: LAPAROSCOPIC LYSIS OF ADHESIONS;  Surgeon: Rubie Maid, MD;  Location: [facility] ORS;  Service: Gynecology;;  . LAPAROSCOPIC SALPINGO OOPHERECTOMY Right 09/17/2015   Procedure: LAPAROSCOPIC SALPINGO OOPHORECTOMY;  Surgeon: Rubie Maid, MD;  Location: [facility] ORS;  Service:  Gynecology;  Laterality: Right;   Social History  Substance Use Topics  . Smoking status: Never Smoker  . Smokeless tobacco: Not on file  . Alcohol use No   Family History  Problem Relation Age of Onset  . Hypertension Mother   . Hypertension Sister   . Diabetes Maternal Grandfather    No Known Allergies No current outpatient prescriptions on file prior to visit.   No current facility-administered medications on file prior to visit.    The PMH, PSH, Social History, Family History, Medications, and allergies have been reviewed in [facility], and have been updated if relevant.   Review of Systems  Constitutional: Negative.   HENT: Negative.   Eyes: Negative.   Respiratory: Negative.   Cardiovascular: Negative.   Gastrointestinal: Negative.   Endocrine: Negative.   Genitourinary: Negative for menstrual problem.  Musculoskeletal: Negative.   Skin: Negative.   Allergic/Immunologic: Negative.   Neurological: Negative.   Hematological: Negative.   Psychiatric/Behavioral: Negative.   All other systems reviewed and are negative.      Objective:   Physical Exam BP 124/80   Pulse (!) 101   Temp 98.1 F (36.7 C) (Oral)   Ht 5'  4" (1.626 m)   Wt 249 lb 12 oz (113.3 kg)   LMP 04/15/2016   SpO2 100%   BMI 42.87 kg/m   General:  Obese,Well-developed,well-nourished,in no acute distress; alert,appropriate and cooperative throughout examination Head:  normocephalic and atraumatic.   Eyes:  vision grossly intact, pupils equal, pupils round, and pupils reactive to light.   Ears:  R ear normal and L ear normal.   Nose:  no external deformity.   Mouth:  good dentition.   Neck:  No deformities, masses, or tenderness noted. Breasts:  No mass, nodules, thickening, tenderness, bulging, retraction, inflamation, nipple discharge or skin changes noted.   Lungs:  Normal respiratory effort, chest expands symmetrically. Lungs are clear to auscultation, no crackles or wheezes. Heart:  Normal  rate and regular rhythm. S1 and S2 normal without gallop, murmur, click, rub or other extra sounds. Abdomen:  Bowel sounds positive,abdomen soft and non-tender without masses, organomegaly or hernias noted. Msk:  No deformity or scoliosis noted of thoracic or lumbar spine.   Extremities:  No clubbing, cyanosis, edema, or deformity noted with normal full range of motion of all joints.   Neurologic:  alert & oriented X3 and gait normal.   Skin:  Intact without suspicious lesions or rashes Cervical Nodes:  No lymphadenopathy noted Axillary Nodes:  No palpable lymphadenopathy Psych:  Cognition and judgment appear intact. Alert and cooperative with normal attention span and concentration. No apparent delusions, illusions, hallucinations     Assessment & Plan:

## 2016-04-29 NOTE — Patient Instructions (Signed)
Great to see you. We will call you with your lab results and you can view them online.  

## 2016-04-29 NOTE — Assessment & Plan Note (Signed)
Reviewed preventive care protocols, scheduled due services, and updated immunizations Discussed nutrition, exercise, diet, and healthy lifestyle.  

## 2016-04-30 ENCOUNTER — Encounter: Payer: Self-pay | Admitting: *Deleted

## 2016-05-09 ENCOUNTER — Encounter: Payer: Self-pay | Admitting: Family Medicine

## 2016-10-27 ENCOUNTER — Encounter: Payer: Self-pay | Admitting: Primary Care

## 2016-10-27 ENCOUNTER — Ambulatory Visit (INDEPENDENT_AMBULATORY_CARE_PROVIDER_SITE_OTHER): Payer: Commercial Managed Care - PPO | Admitting: Primary Care

## 2016-10-27 VITALS — BP 116/82 | HR 61 | Temp 97.3°F | Ht 64.0 in | Wt 252.0 lb

## 2016-10-27 DIAGNOSIS — J069 Acute upper respiratory infection, unspecified: Secondary | ICD-10-CM | POA: Diagnosis not present

## 2016-10-27 MED ORDER — BENZONATATE 200 MG PO CAPS: 200.0000 mg | ORAL_CAPSULE | Freq: Three times a day (TID) | ORAL | 0 refills | Status: DC | PRN

## 2016-10-27 NOTE — Progress Notes (Signed)
Pre visit review using our clinic review tool, if applicable. No additional management support is needed unless otherwise documented below in the visit note. 

## 2016-10-27 NOTE — Progress Notes (Signed)
Subjective:    Patient ID: Shirley Garcia, female    DOB: Aug 21, 1976, 41 y.o.   MRN: [id number]  HPI  Shirley Garcia is a 41 year old female who presents today with a chief complaint of nasal congestion. She also reports cough, low grade fevers, headaches. Her symptoms began 3 days ago. Her cough began last night. She's taken Claritin and Ibuprofen with no improvement.   Review of Systems  Constitutional: Positive for chills, fatigue and fever.  HENT: Positive for congestion and rhinorrhea. Negative for sore throat.   Respiratory: Positive for cough.   Cardiovascular: Negative for chest pain.  Neurological: Positive for headaches.       Past Medical History:  Diagnosis Date  . Depression   . H/O cervical polypectomy 2008  . History of PCOS   . Ovarian cyst   . PCOS (polycystic ovarian syndrome)      Social History   Social History  . Marital status: Single    Spouse name: N/A  . Number of children: N/A  . Years of education: N/A   Occupational History  . Not on file.   Social History Main Topics  . Smoking status: Never Smoker  . Smokeless tobacco: Never Used  . Alcohol use No  . Drug use: No  . Sexual activity: No   Other Topics Concern  . 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 CERVICAL CERCLAGE    . cervix polpys    . CESAREAN SECTION     x1  . CYSTECTOMY Bilateral 1996  . LAPAROSCOPIC LYSIS OF ADHESIONS  09/17/2015   Procedure: LAPAROSCOPIC LYSIS OF ADHESIONS;  Surgeon: Rubie Maid, MD;  Location: [facility] OR[facility];  Service: Gynecology;;  . LAPAROSCOPIC SALPINGO OOPHERECTOMY Right 09/17/2015   Procedure: LAPAROSCOPIC SALPINGO OOPHORECTOMY;  Surgeon: Rubie Maid, MD;  Location: [facility] OR[facility];  Service: Gynecology;  Laterality: Right;    Family History  Problem Relation Age of Onset  . Hypertension Mother   . Hypertension Sister   . Diabetes Maternal Grandfather     No Known  Allergies  No current outpatient prescriptions on file prior to visit.   No current facility-administered medications on file prior to visit.     BP 116/82   Pulse 61   Temp 97.3 F (36.3 C) (Oral)   Ht 5\' 4"  (1.626 m)   Wt 252 lb (114.3 kg)   LMP 09/03/2016   SpO2 99%   BMI 43.26 kg/m    Objective:   Physical Exam  Constitutional: She appears well-nourished. She appears ill.  HENT:  Right Ear: Tympanic membrane and ear canal normal.  Left Ear: Tympanic membrane and ear canal normal.  Nose: Mucosal edema present. Right sinus exhibits no maxillary sinus tenderness and no frontal sinus tenderness. Left sinus exhibits no maxillary sinus tenderness and no frontal sinus tenderness.  Mouth/Throat: Oropharynx is clear and moist.  Eyes: Conjunctivae are normal.  Neck: Neck supple.  Cardiovascular: Normal rate and regular rhythm.   Pulmonary/Chest: Effort normal and breath sounds normal. She has no wheezes. She has no rales.  Lymphadenopathy:    She has no cervical adenopathy.  Skin: Skin is warm and dry.          Assessment & Plan:  URI:  Cough, nasal congestion, fatigue x 3 days. No improvement with OTC treatment. Exam today consistent for viral URI. Lungs clear, does  appear ill but not sickly. Suspect viral involvement and will treat with supportive measures.  Discussed use of Flonase, Rx for Tessalon pearls sent to pharmacy.  Fluids, rest, follow up PRN.  Sheral Flow, NP

## 2016-10-27 NOTE — Patient Instructions (Signed)
Your symptoms are representative of a viral illness which will resolve on its own over time. Our goal is to treat your symptoms in order to aid your body in the healing process and to make you more comfortable.   Nasal Congestion/Ear Pressure: Try using Flonase (fluticasone) nasal spray. Instill 1 spray in each nostril twice daily.   You may take Benzonatate capsules for cough. Take 1 capsule by mouth three times daily as needed for cough.  Continue Ibuprofen as needed for headaches.  Please notify me if you develop persistent fevers of 101, start coughing up green mucous, notice increased fatigue or weakness, or feel worse after 1 week of onset of symptoms.   Increase consumption of water intake and rest.  It was a pleasure meeting you!   Upper Respiratory Infection, Adult Most upper respiratory infections (URIs) are a viral infection of the air passages leading to the lungs. A URI affects the nose, throat, and upper air passages. The most common type of URI is nasopharyngitis and is typically referred to as "the common cold." URIs run their course and usually go away on their own. Most of the time, a URI does not require medical attention, but sometimes a bacterial infection in the upper airways can follow a viral infection. This is called a secondary infection. Sinus and middle ear infections are common types of secondary upper respiratory infections. Bacterial pneumonia can also complicate a URI. A URI can worsen asthma and chronic obstructive pulmonary disease (COPD). Sometimes, these complications can require emergency medical care and may be life threatening. What are the causes? Almost all URIs are caused by viruses. A virus is a type of germ and can spread from one person to another. What increases the risk? You may be at risk for a URI if:  You smoke.  You have chronic heart or lung disease.  You have a weakened defense (immune) system.  You are very young or very old.  You  have nasal allergies or asthma.  You work in crowded or poorly ventilated areas.  You work in health care facilities or schools. What are the signs or symptoms? Symptoms typically develop 2-3 days after you come in contact with a cold virus. Most viral URIs last 7-10 days. However, viral URIs from the influenza virus (flu virus) can last 14-18 days and are typically more severe. Symptoms may include:  Runny or stuffy (congested) nose.  Sneezing.  Cough.  Sore throat.  Headache.  Fatigue.  Fever.  Loss of appetite.  Pain in your forehead, behind your eyes, and over your cheekbones (sinus pain).  Muscle aches. How is this diagnosed? Your health care provider may diagnose a URI by:  Physical exam.  Tests to check that your symptoms are not due to another condition such as:  Strep throat.  Sinusitis.  Pneumonia.  Asthma. How is this treated? A URI goes away on its own with time. It cannot be cured with medicines, but medicines may be prescribed or recommended to relieve symptoms. Medicines may help:  Reduce your fever.  Reduce your cough.  Relieve nasal congestion. Follow these instructions at home:  Take medicines only as directed by your health care provider.  Gargle warm saltwater or take cough drops to comfort your throat as directed by your health care provider.  Use a warm mist humidifier or inhale steam from a shower to increase air moisture. This may make it easier to breathe.  Drink enough fluid to keep your urine clear or  pale yellow.  Eat soups and other clear broths and maintain good nutrition.  Rest as needed.  Return to work when your temperature has returned to normal or as your health care provider advises. You may need to stay home longer to avoid infecting others. You can also use a face mask and careful hand washing to prevent spread of the virus.  Increase the usage of your inhaler if you have asthma.  Do not use any tobacco products,  including cigarettes, chewing tobacco, or electronic cigarettes. If you need help quitting, ask your health care provider. How is this prevented? The best way to protect yourself from getting a cold is to practice good hygiene.  Avoid oral or hand contact with people with cold symptoms.  Wash your hands often if contact occurs. There is no clear evidence that vitamin C, vitamin E, echinacea, or exercise reduces the chance of developing a cold. However, it is always recommended to get plenty of rest, exercise, and practice good nutrition. Contact a health care provider if:  You are getting worse rather than better.  Your symptoms are not controlled by medicine.  You have chills.  You have worsening shortness of breath.  You have brown or red mucus.  You have yellow or brown nasal discharge.  You have pain in your face, especially when you bend forward.  You have a fever.  You have swollen neck glands.  You have pain while swallowing.  You have white areas in the back of your throat. Get help right away if:  You have severe or persistent:  Headache.  Ear pain.  Sinus pain.  Chest pain.  You have chronic lung disease and any of the following:  Wheezing.  Prolonged cough.  Coughing up blood.  A change in your usual mucus.  You have a stiff neck.  You have changes in your:  Vision.  Hearing.  Thinking.  Mood. This information is not intended to replace advice given to you by your health care provider. Make sure you discuss any questions you have with your health care provider. Document Released: 02/11/2001 Document Revised: 04/20/2016 Document Reviewed: 11/23/2013 Elsevier Interactive Patient Education  2017 [employer].

## 2016-10-28 ENCOUNTER — Other Ambulatory Visit: Payer: Self-pay | Admitting: Primary Care

## 2016-10-28 ENCOUNTER — Encounter: Payer: Self-pay | Admitting: Family Medicine

## 2016-10-28 ENCOUNTER — Encounter: Payer: Self-pay | Admitting: Primary Care

## 2016-10-28 DIAGNOSIS — J069 Acute upper respiratory infection, unspecified: Secondary | ICD-10-CM

## 2016-10-28 MED ORDER — HYDROCOD POLST-CPM POLST ER 10-8 MG/5ML PO SUER: 5.0000 mL | Freq: Two times a day (BID) | ORAL | 0 refills | Status: DC | PRN

## 2016-10-30 ENCOUNTER — Encounter: Payer: Self-pay | Admitting: Primary Care

## 2017-04-23 ENCOUNTER — Other Ambulatory Visit: Payer: Self-pay | Admitting: Family Medicine

## 2017-04-23 DIAGNOSIS — Z01419 Encounter for gynecological examination (general) (routine) without abnormal findings: Secondary | ICD-10-CM

## 2017-04-30 ENCOUNTER — Other Ambulatory Visit (INDEPENDENT_AMBULATORY_CARE_PROVIDER_SITE_OTHER): Payer: Commercial Managed Care - PPO

## 2017-04-30 DIAGNOSIS — Z01419 Encounter for gynecological examination (general) (routine) without abnormal findings: Secondary | ICD-10-CM | POA: Diagnosis not present

## 2017-04-30 LAB — COMPREHENSIVE METABOLIC PANEL
ALBUMIN: 3.9 g/dL (ref 3.5–5.2)
ALK PHOS: 60 U/L (ref 39–117)
ALT: 13 U/L (ref 0–35)
AST: 11 U/L (ref 0–37)
BUN: 10 mg/dL (ref 6–23)
CALCIUM: 9.2 mg/dL (ref 8.4–10.5)
CO2: 28 mEq/L (ref 19–32)
Chloride: 107 mEq/L (ref 96–112)
Creatinine, Ser: 0.65 mg/dL (ref 0.40–1.20)
GFR: 129.65 mL/min (ref >60.00)
GLUCOSE: 110 mg/dL — AB (ref 70–99)
POTASSIUM: 4 meq/L (ref 3.5–5.1)
Sodium: 139 mEq/L (ref 135–145)
Total Bilirubin: 0.3 mg/dL (ref 0.2–1.2)
Total Protein: 6.7 g/dL (ref 6.0–8.3)

## 2017-04-30 LAB — CBC WITH DIFFERENTIAL/PLATELET
Basophils Absolute: 0 10*3/uL (ref 0.0–0.1)
Basophils Relative: 0.8 % (ref 0.0–3.0)
EOS PCT: 10.2 % — AB (ref 0.0–5.0)
Eosinophils Absolute: 0.6 10*3/uL (ref 0.0–0.7)
HEMATOCRIT: 37.5 % (ref 36.0–46.0)
Hemoglobin: 12.2 g/dL (ref 12.0–15.0)
LYMPHS ABS: 3 10*3/uL (ref 0.7–4.0)
LYMPHS PCT: 48.6 % — AB (ref 12.0–46.0)
MCHC: 32.5 g/dL (ref 30.0–36.0)
MCV: 89.4 fl (ref 78.0–100.0)
MONOS PCT: 6.1 % (ref 3.0–12.0)
Monocytes Absolute: 0.4 10*3/uL (ref 0.1–1.0)
NEUTROS PCT: 34.3 % — AB (ref 43.0–77.0)
Neutro Abs: 2.1 10*3/uL (ref 1.4–7.7)
Platelets: 350 10*3/uL (ref 150.0–400.0)
RBC: 4.2 Mil/uL (ref 3.87–5.11)
RDW: 14.5 % (ref 11.5–15.5)
WBC: 6.1 10*3/uL (ref 4.0–10.5)

## 2017-04-30 LAB — LIPID PANEL
CHOLESTEROL: 120 mg/dL (ref 0–200)
HDL: 60.5 mg/dL (ref >39.00)
LDL Cholesterol: 42 mg/dL (ref 0–99)
NonHDL: 59.18
TRIGLYCERIDES: 85 mg/dL (ref 0.0–149.0)
Total CHOL/HDL Ratio: 2
VLDL: 17 mg/dL (ref 0.0–40.0)

## 2017-04-30 LAB — TSH: TSH: 1.19 u[IU]/mL (ref 0.35–4.50)

## 2017-05-05 ENCOUNTER — Encounter: Payer: Self-pay | Admitting: Family Medicine

## 2017-05-05 ENCOUNTER — Ambulatory Visit (INDEPENDENT_AMBULATORY_CARE_PROVIDER_SITE_OTHER): Payer: Commercial Managed Care - PPO | Admitting: Family Medicine

## 2017-05-05 ENCOUNTER — Other Ambulatory Visit (HOSPITAL_COMMUNITY)
Admission: RE | Admit: 2017-05-05 | Discharge: 2017-05-05 | Disposition: A | Discharge status: Home / Self Care | Payer: Commercial Managed Care - PPO | Source: Ambulatory Visit | Attending: Family Medicine | Admitting: Family Medicine

## 2017-05-05 VITALS — BP 126/82 | HR 81 | Temp 98.1°F | Resp 16 | Ht 64.25 in | Wt 255.1 lb

## 2017-05-05 DIAGNOSIS — Z1239 Encounter for other screening for malignant neoplasm of breast: Secondary | ICD-10-CM

## 2017-05-05 DIAGNOSIS — Z1231 Encounter for screening mammogram for malignant neoplasm of breast: Secondary | ICD-10-CM | POA: Diagnosis not present

## 2017-05-05 DIAGNOSIS — Z124 Encounter for screening for malignant neoplasm of cervix: Secondary | ICD-10-CM | POA: Insufficient documentation

## 2017-05-05 DIAGNOSIS — Z01419 Encounter for gynecological examination (general) (routine) without abnormal findings: Secondary | ICD-10-CM | POA: Diagnosis not present

## 2017-05-05 NOTE — Assessment & Plan Note (Signed)
Reviewed preventive care protocols, scheduled due services, and updated immunizations Discussed nutrition, exercise, diet, and healthy lifestyle.  

## 2017-05-05 NOTE — Progress Notes (Signed)
Subjective:    Patient ID: Shirley Garcia, female    DOB: 04-18-1976, 41 y.o.   MRN: [id number]  HPI  41 yo pleasant G2P2 here for CPX and follow up of chronic medical conditions.  No h/o abnormal pap smears.  Last pap smear was 04/03/15- done by me. No known family history of cervical or ovarian CA.  Great aunt had breast CA.  Due for mammogram.  Using condoms for contraception.  Wt Readings from Last 3 Encounters:  05/05/17 255 lb 1.9 oz (115.7 kg)  10/27/16 252 lb (114.3 kg)  04/29/16 249 lb 12 oz (113.3 kg)     Lab Results  Component Value Date   WBC 6.1 04/30/2017   HGB 12.2 04/30/2017   HCT 37.5 04/30/2017   MCV 89.4 04/30/2017   PLT 350.0 04/30/2017    Lab Results  Component Value Date   CREATININE 0.65 04/30/2017   Lab Results  Component Value Date   CHOL 120 04/30/2017   HDL 60.50 04/30/2017   LDLCALC 42 04/30/2017   TRIG 85.0 04/30/2017   CHOLHDL 2 04/30/2017   Lab Results  Component Value Date   ALT 13 04/30/2017   AST 11 04/30/2017   ALKPHOS 60 04/30/2017   BILITOT 0.3 04/30/2017   Lab Results  Component Value Date   TSH 1.19 04/30/2017   Lab Results  Component Value Date   WBC 6.1 04/30/2017   HGB 12.2 04/30/2017   HCT 37.5 04/30/2017   MCV 89.4 04/30/2017   PLT 350.0 04/30/2017     Patient Active Problem List   Diagnosis Date Noted  . Well woman exam 04/03/2015  . Obesity 08/18/2012   Past Medical History:  Diagnosis Date  . Depression   . H/O cervical polypectomy 2008  . History of PCOS   . Ovarian cyst   . PCOS (polycystic ovarian syndrome)    Past Surgical History:  Procedure Laterality Date  . CERVICAL CERCLAGE    . cervix polpys    . CESAREAN SECTION     x1  . CYSTECTOMY Bilateral 1996  . LAPAROSCOPIC LYSIS OF ADHESIONS  09/17/2015   Procedure: LAPAROSCOPIC LYSIS OF ADHESIONS;  Surgeon: Rubie Maid, MD;  Location: [facility];  Service: Gynecology;;  . LAPAROSCOPIC SALPINGO OOPHERECTOMY Right 09/17/2015   Procedure: LAPAROSCOPIC SALPINGO OOPHORECTOMY;  Surgeon: Rubie Maid, MD;  Location: [facility];  Service: Gynecology;  Laterality: Right;   Social History  Substance Use Topics  . Smoking status: Never Smoker  . Smokeless tobacco: Never Used  . Alcohol use No   Family History  Problem Relation Age of Onset  . Hypertension Mother   . Hypertension Sister   . Diabetes Maternal Grandfather    No Known Allergies No current outpatient prescriptions on file prior to visit.   No current facility-administered medications on file prior to visit.    The PMH, PSH, Social History, Family History, Medications, and allergies have been reviewed in [facility], and have been updated if relevant.   Review of Systems  Constitutional: Negative.   HENT: Negative.   Eyes: Negative.   Respiratory: Negative.   Cardiovascular: Negative.   Gastrointestinal: Negative.   Endocrine: Negative.   Genitourinary: Negative for menstrual problem.  Musculoskeletal: Negative.   Skin: Negative.   Allergic/Immunologic: Negative.   Neurological: Negative.   Hematological: Negative.   Psychiatric/Behavioral: Negative.   All other systems reviewed and are negative.      Objective:   Physical Exam BP 126/82   Pulse 81  Temp 98.1 F (36.7 C) (Oral)   Resp 16   Ht 5' 4.25" (1.632 m)   Wt 255 lb 1.9 oz (115.7 kg)   SpO2 98%   BMI 43.45 kg/m    General:  Well-developed,well-nourished,in no acute distress; alert,appropriate and cooperative throughout examination Head:  normocephalic and atraumatic.   Eyes:  vision grossly intact, PERRL Ears:  R ear normal and L ear normal externally, TMs clear bilaterally Nose:  no external deformity.   Mouth:  good dentition.   Neck:  No deformities, masses, or tenderness noted. Breasts:  No mass, nodules, thickening, tenderness, bulging, retraction, inflamation, nipple discharge or skin changes noted.   Lungs:  Normal respiratory effort, chest expands symmetrically. Lungs  are clear to auscultation, no crackles or wheezes. Heart:  Normal rate and regular rhythm. S1 and S2 normal without gallop, murmur, click, rub or other extra sounds. Abdomen:  Bowel sounds positive,abdomen soft and non-tender without masses, organomegaly or hernias noted. Rectal:  no external abnormalities.   Genitalia:  Pelvic Exam:        External: normal female genitalia without lesions or masses        Vagina: normal without lesions or masses        Cervix: normal without lesions or masses        Adnexa: normal bimanual exam without masses or fullness        Uterus: normal by palpation        Pap smear: performed Msk:  No deformity or scoliosis noted of thoracic or lumbar spine.   Extremities:  No clubbing, cyanosis, edema, or deformity noted with normal full range of motion of all joints.   Neurologic:  alert & oriented X3 and gait normal.   Skin:  Intact without suspicious lesions or rashes Cervical Nodes:  No lymphadenopathy noted Axillary Nodes:  No palpable lymphadenopathy Psych:  Cognition and judgment appear intact. Alert and cooperative with normal attention span and concentration. No apparent delusions, illusions, hallucinations     Assessment & Plan:

## 2017-05-05 NOTE — Assessment & Plan Note (Signed)
Pap smear done today

## 2017-05-06 ENCOUNTER — Encounter: Payer: Self-pay | Admitting: Family Medicine

## 2017-05-07 ENCOUNTER — Encounter: Payer: Self-pay | Admitting: Family Medicine

## 2017-05-08 LAB — CYTOLOGY - PAP
Adequacy: ABSENT
CHLAMYDIA, DNA PROBE: NEGATIVE
DIAGNOSIS: NEGATIVE
HPV (WINDOPATH): NOT DETECTED
Neisseria Gonorrhea: NEGATIVE

## 2017-05-20 ENCOUNTER — Ambulatory Visit
Admission: RE | Admit: 2017-05-20 | Discharge: 2017-05-20 | Disposition: A | Discharge status: Home / Self Care | Payer: Commercial Managed Care - PPO | Source: Ambulatory Visit | Attending: Family Medicine | Admitting: Family Medicine

## 2017-05-20 DIAGNOSIS — Z1239 Encounter for other screening for malignant neoplasm of breast: Secondary | ICD-10-CM

## 2017-05-20 DIAGNOSIS — Z1231 Encounter for screening mammogram for malignant neoplasm of breast: Secondary | ICD-10-CM | POA: Diagnosis present

## 2017-05-27 ENCOUNTER — Encounter: Payer: Self-pay | Admitting: Family Medicine

## 2017-10-14 ENCOUNTER — Encounter: Payer: Self-pay | Admitting: Family Medicine

## 2017-10-14 ENCOUNTER — Ambulatory Visit: Payer: Commercial Managed Care - PPO | Admitting: Family Medicine

## 2017-10-14 VITALS — BP 122/74 | HR 98 | Temp 98.2°F | Ht 64.25 in | Wt 255.2 lb

## 2017-10-14 DIAGNOSIS — J020 Streptococcal pharyngitis: Secondary | ICD-10-CM | POA: Diagnosis not present

## 2017-10-14 DIAGNOSIS — J029 Acute pharyngitis, unspecified: Secondary | ICD-10-CM

## 2017-10-14 LAB — POCT RAPID STREP A (OFFICE): Rapid Strep A Screen: POSITIVE — AB

## 2017-10-14 MED ORDER — AMOXICILLIN 500 MG PO CAPS: 500.0000 mg | ORAL_CAPSULE | Freq: Three times a day (TID) | ORAL | 0 refills | Status: DC

## 2017-10-14 NOTE — Progress Notes (Signed)
Subjective:    Patient ID: Shirley Garcia, female    DOB: 01-26-1976, 42 y.o.   MRN: [id number]  HPI Here for ST  No strep exposures  She has kids  No specific exposures   Symptoms started yesterday  Had chills/felt bad  Very bad sore and swollen  Hurts to swallow   Temp nl at home   Temp: 98.2 F (36.8 C)   Took ibuprofen over the counter  Drinks fluids  Did salt water gargle   No cough  No headache  Had some congestion yesterday     Pos rst Results for orders placed or performed in visit on 10/14/17  Rapid Strep A  Result Value Ref Range   Rapid Strep A Screen Positive (A) Negative     Patient Active Problem List   Diagnosis Date Noted  . Strep throat 10/14/2017  . Visit for routine gyn exam 05/05/2017  . Well woman exam 04/03/2015  . Obesity 08/18/2012   Past Medical History:  Diagnosis Date  . Depression   . H/O cervical polypectomy 2008  . History of PCOS   . Ovarian cyst   . PCOS (polycystic ovarian syndrome)    Past Surgical History:  Procedure Laterality Date  . CERVICAL CERCLAGE    . cervix polpys    . CESAREAN SECTION     x1  . CYSTECTOMY Bilateral 1996  . LAPAROSCOPIC LYSIS OF ADHESIONS  09/17/2015   Procedure: LAPAROSCOPIC LYSIS OF ADHESIONS;  Surgeon: Rubie Maid, MD;  Location: [facility];  Service: Gynecology;;  . LAPAROSCOPIC SALPINGO OOPHERECTOMY Right 09/17/2015   Procedure: LAPAROSCOPIC SALPINGO OOPHORECTOMY;  Surgeon: Rubie Maid, MD;  Location: [facility];  Service: Gynecology;  Laterality: Right;   Social History   Tobacco Use  . Smoking status: Never Smoker  . Smokeless tobacco: Never Used  Substance Use Topics  . Alcohol use: No    Alcohol/week: 0.0 oz  . Drug use: No   Family History  Problem Relation Age of Onset  . Hypertension Mother   . Hypertension Sister   . Diabetes Maternal Grandfather    No Known Allergies No current outpatient medications on file prior to visit.   No current facility-administered  medications on file prior to visit.     Review of Systems  Constitutional: Positive for appetite change, chills and fatigue. Negative for activity change, fever and unexpected weight change.  HENT: Positive for sore throat. Negative for congestion, ear pain, mouth sores, postnasal drip, rhinorrhea, sinus pressure and trouble swallowing.   Eyes: Negative for pain, redness and visual disturbance.  Respiratory: Negative for cough, shortness of breath and wheezing.   Cardiovascular: Negative for chest pain and palpitations.  Gastrointestinal: Negative for abdominal pain, blood in stool, constipation and diarrhea.  Endocrine: Negative for polydipsia and polyuria.  Genitourinary: Negative for dysuria, frequency and urgency.  Musculoskeletal: Negative for arthralgias, back pain and myalgias.  Skin: Negative for pallor and rash.  Allergic/Immunologic: Negative for environmental allergies.  Neurological: Negative for dizziness, syncope and headaches.  Hematological: Negative for adenopathy. Does not bruise/bleed easily.  Psychiatric/Behavioral: Negative for decreased concentration and dysphoric mood. The patient is not nervous/anxious.        Objective:   Physical Exam  Constitutional: She appears well-developed and well-nourished. No distress.  obese and well appearing   HENT:  Head: Normocephalic and atraumatic.  Right Ear: External ear normal.  Left Ear: External ear normal.  Throat - diffuse erythema with tonsillar swelling  No exudate or lesions  Nares are boggy  Eyes: Conjunctivae and EOM are normal. Pupils are equal, round, and reactive to light. Right eye exhibits no discharge. Left eye exhibits no discharge.  Neck: Normal range of motion. Neck supple.  Cardiovascular: Regular rhythm and normal heart sounds.  Pulmonary/Chest: Effort normal and breath sounds normal. No respiratory distress. She has no wheezes. She has no rales.  Lymphadenopathy:    She has no cervical adenopathy.    Skin: Skin is warm and dry. No rash noted. No erythema. No pallor.  Psychiatric: She has a normal mood and affect.          Assessment & Plan:   Problem List Items Addressed This Visit      Respiratory   Strep throat    tx with amoxicillin Fluids/rest/ibuprofen /supp care Handout given Disc symptomatic care - see instructions on AVS  Off work 2 d  Update if not starting to improve in a week or if worsening         Other Visit Diagnoses    Sore throat    -  Primary   Relevant Orders   Rapid Strep A (Completed)

## 2017-10-14 NOTE — Patient Instructions (Addendum)
Drink fluids  Continue ibuprofen as directed (take with food or milk)  Take the amoxicillin as directed for strep throat   Out of work today and tomorrow    Update if not starting to improve in a week or if worsening

## 2017-10-14 NOTE — Assessment & Plan Note (Signed)
tx with amoxicillin Fluids/rest/ibuprofen /supp care Handout given Disc symptomatic care - see instructions on AVS  Off work 2 d  Update if not starting to improve in a week or if worsening

## 2017-11-30 ENCOUNTER — Ambulatory Visit: Payer: Commercial Managed Care - PPO | Admitting: Internal Medicine

## 2018-01-11 ENCOUNTER — Encounter: Payer: Self-pay | Admitting: Internal Medicine

## 2018-01-11 ENCOUNTER — Ambulatory Visit: Payer: Commercial Managed Care - PPO | Admitting: Internal Medicine

## 2018-01-12 NOTE — Progress Notes (Signed)
Opened in error

## 2018-01-29 ENCOUNTER — Other Ambulatory Visit: Payer: Self-pay | Admitting: Internal Medicine

## 2018-01-29 MED ORDER — SILVER SULFADIAZINE 1 % EX CREA: 1.0000 "application " | TOPICAL_CREAM | Freq: Every day | CUTANEOUS | 0 refills | Status: DC

## 2018-04-26 ENCOUNTER — Other Ambulatory Visit: Payer: Self-pay | Admitting: Family Medicine

## 2018-04-27 ENCOUNTER — Ambulatory Visit (INDEPENDENT_AMBULATORY_CARE_PROVIDER_SITE_OTHER): Payer: Commercial Managed Care - PPO | Admitting: Family Medicine

## 2018-04-27 VITALS — BP 118/80 | HR 71 | Temp 98.1°F | Ht 64.25 in | Wt 251.2 lb

## 2018-04-27 DIAGNOSIS — Z23 Encounter for immunization: Secondary | ICD-10-CM | POA: Diagnosis not present

## 2018-04-27 DIAGNOSIS — E669 Obesity, unspecified: Secondary | ICD-10-CM | POA: Insufficient documentation

## 2018-04-27 DIAGNOSIS — Z6841 Body Mass Index (BMI) 40.0 and over, adult: Secondary | ICD-10-CM | POA: Insufficient documentation

## 2018-04-27 DIAGNOSIS — Z0000 Encounter for general adult medical examination without abnormal findings: Secondary | ICD-10-CM | POA: Insufficient documentation

## 2018-04-27 DIAGNOSIS — E282 Polycystic ovarian syndrome: Secondary | ICD-10-CM | POA: Insufficient documentation

## 2018-04-27 LAB — COMPREHENSIVE METABOLIC PANEL
ALT: 12 U/L (ref 0–35)
AST: 13 U/L (ref 0–37)
Albumin: 4.2 g/dL (ref 3.5–5.2)
Alkaline Phosphatase: 68 U/L (ref 39–117)
BUN: 13 mg/dL (ref 6–23)
CHLORIDE: 105 meq/L (ref 96–112)
CO2: 29 mEq/L (ref 19–32)
Calcium: 9.7 mg/dL (ref 8.4–10.5)
Creatinine, Ser: 0.7 mg/dL (ref 0.40–1.20)
GFR: 118.44 mL/min (ref >60.00)
Glucose, Bld: 94 mg/dL (ref 70–99)
POTASSIUM: 3.9 meq/L (ref 3.5–5.1)
SODIUM: 140 meq/L (ref 135–145)
Total Bilirubin: 0.4 mg/dL (ref 0.2–1.2)
Total Protein: 7.4 g/dL (ref 6.0–8.3)

## 2018-04-27 LAB — LIPID PANEL
CHOLESTEROL: 121 mg/dL (ref 0–200)
HDL: 60.8 mg/dL (ref >39.00)
LDL CALC: 43 mg/dL (ref 0–99)
NonHDL: 59.88
TRIGLYCERIDES: 83 mg/dL (ref 0.0–149.0)
Total CHOL/HDL Ratio: 2
VLDL: 16.6 mg/dL (ref 0.0–40.0)

## 2018-04-27 LAB — TSH: TSH: 0.88 u[IU]/mL (ref 0.35–4.50)

## 2018-04-27 NOTE — Assessment & Plan Note (Signed)
Reviewed preventive care protocols, scheduled due services, and updated immunizations Discussed nutrition, exercise, diet, and healthy lifestyle.  Orders Placed This Encounter  Procedures  . Comprehensive metabolic panel  . Lipid panel  . TSH

## 2018-04-27 NOTE — Progress Notes (Signed)
Subjective:    Patient ID: Shirley Garcia, female    DOB: 1975/12/11, 42 y.o.   MRN: [id number]  HPI  42 yo pleasant G2P2 here for CPX and follow up of chronic medical conditions.  No h/o abnormal pap smears.  Last pap smear was 05/05/17- done by me. No known family history of cervical or ovarian CA.  Mammogram 05/19/17.   Using condoms for contraception.  Obesity- BM >40.  She has lost 4 pounds since February but feels she is not losing weight.  Asking for weight loss rxs.  Wt Readings from Last 3 Encounters:  04/27/18 251 lb 3.2 oz (113.9 kg)  10/14/17 255 lb 4 oz (115.8 kg)  05/05/17 255 lb 1.9 oz (115.7 kg)     Lab Results  Component Value Date   WBC 6.1 04/30/2017   HGB 12.2 04/30/2017   HCT 37.5 04/30/2017   MCV 89.4 04/30/2017   PLT 350.0 04/30/2017    Lab Results  Component Value Date   CREATININE 0.65 04/30/2017   Lab Results  Component Value Date   CHOL 120 04/30/2017   HDL 60.50 04/30/2017   LDLCALC 42 04/30/2017   TRIG 85.0 04/30/2017   CHOLHDL 2 04/30/2017   Lab Results  Component Value Date   ALT 13 04/30/2017   AST 11 04/30/2017   ALKPHOS 60 04/30/2017   BILITOT 0.3 04/30/2017   Lab Results  Component Value Date   TSH 1.19 04/30/2017   Lab Results  Component Value Date   WBC 6.1 04/30/2017   HGB 12.2 04/30/2017   HCT 37.5 04/30/2017   MCV 89.4 04/30/2017   PLT 350.0 04/30/2017     Patient Active Problem List   Diagnosis Date Noted  . PCOS (polycystic ovarian syndrome) 04/27/2018  . Well woman exam without gynecological exam 04/27/2018   Past Medical History:  Diagnosis Date  . Depression   . H/O cervical polypectomy 2008  . History of PCOS   . Ovarian cyst   . PCOS (polycystic ovarian syndrome)    Past Surgical History:  Procedure Laterality Date  . CERVICAL CERCLAGE    . cervix polpys    . CESAREAN SECTION     x1  . CYSTECTOMY Bilateral 1996  . LAPAROSCOPIC LYSIS OF ADHESIONS  09/17/2015   Procedure: LAPAROSCOPIC  LYSIS OF ADHESIONS;  Surgeon: Rubie Maid, MD;  Location: [facility];  Service: Gynecology;;  . LAPAROSCOPIC SALPINGO OOPHERECTOMY Right 09/17/2015   Procedure: LAPAROSCOPIC SALPINGO OOPHORECTOMY;  Surgeon: Rubie Maid, MD;  Location: [facility];  Service: Gynecology;  Laterality: Right;   Social History   Tobacco Use  . Smoking status: Never Smoker  . Smokeless tobacco: Never Used  Substance Use Topics  . Alcohol use: No    Alcohol/week: 0.0 standard drinks  . Drug use: No   Family History  Problem Relation Age of Onset  . Hypertension Mother   . Hypertension Sister   . Diabetes Maternal Grandfather    No Known Allergies Current Outpatient Medications on File Prior to Visit  Medication Sig Dispense Refill  . silver sulfADIAZINE (SILVADENE) 1 % cream Apply 1 application topically daily. (Patient not taking: Reported on 04/27/2018) 50 g 0   No current facility-administered medications on file prior to visit.    The PMH, PSH, Social History, Family History, Medications, and allergies have been reviewed in [facility], and have been updated if relevant.   Review of Systems  Constitutional: Negative.   HENT: Negative.   Eyes: Negative.   Respiratory:  Negative.   Cardiovascular: Negative.   Gastrointestinal: Negative.   Endocrine: Negative.   Genitourinary: Negative for menstrual problem.  Musculoskeletal: Negative.   Skin: Negative.   Allergic/Immunologic: Negative.   Neurological: Negative.   Hematological: Negative.   Psychiatric/Behavioral: Negative.   All other systems reviewed and are negative.      Objective:   Physical Exam BP 118/80 (BP Location: Left Arm, Patient Position: Sitting, Cuff Size: Large)   Pulse 71   Temp 98.1 F (36.7 C) (Oral)   Ht 5' 4.25" (1.632 m)   Wt 251 lb 3.2 oz (113.9 kg)   SpO2 100%   BMI 42.78 kg/m     General:  Well-developed,well-nourished,in no acute distress; alert,appropriate and cooperative throughout examination Head:   normocephalic and atraumatic.   Eyes:  vision grossly intact, PERRL Ears:  R ear normal and L ear normal externally, TMs clear bilaterally Nose:  no external deformity.   Mouth:  good dentition.   Neck:  No deformities, masses, or tenderness noted. Breasts:  No mass, nodules, thickening, tenderness, bulging, retraction, inflamation, nipple discharge or skin changes noted.   Lungs:  Normal respiratory effort, chest expands symmetrically. Lungs are clear to auscultation, no crackles or wheezes. Heart:  Normal rate and regular rhythm. S1 and S2 normal without gallop, murmur, click, rub or other extra sounds. Abdomen:  Bowel sounds positive,abdomen soft and non-tender without masses, organomegaly or hernias noted. Msk:  No deformity or scoliosis noted of thoracic or lumbar spine.   Extremities:  No clubbing, cyanosis, edema, or deformity noted with normal full range of motion of all joints.   Neurologic:  alert & oriented X3 and gait normal.   Skin:  Intact without suspicious lesions or rashes Cervical Nodes:  No lymphadenopathy noted Axillary Nodes:  No palpable lymphadenopathy Psych:  Cognition and judgment appear intact. Alert and cooperative with normal attention span and concentration. No apparent delusions, illusions, hallucinations      Assessment & Plan:

## 2018-04-27 NOTE — Patient Instructions (Signed)
Great to see you. I will call you with your lab results from today and you can view them online.   

## 2018-04-27 NOTE — Assessment & Plan Note (Signed)
Given her referral to weight loss clinic through [facility].  She will call the number to see if she qualifies.

## 2018-04-28 ENCOUNTER — Other Ambulatory Visit: Payer: Self-pay | Admitting: Family Medicine

## 2018-04-28 DIAGNOSIS — Z1231 Encounter for screening mammogram for malignant neoplasm of breast: Secondary | ICD-10-CM

## 2018-05-25 ENCOUNTER — Ambulatory Visit
Admission: RE | Admit: 2018-05-25 | Discharge: 2018-05-25 | Disposition: A | Discharge status: Home / Self Care | Payer: Commercial Managed Care - PPO | Source: Ambulatory Visit | Attending: Family Medicine | Admitting: Family Medicine

## 2018-05-25 DIAGNOSIS — Z1231 Encounter for screening mammogram for malignant neoplasm of breast: Secondary | ICD-10-CM

## 2018-06-29 ENCOUNTER — Encounter (INDEPENDENT_AMBULATORY_CARE_PROVIDER_SITE_OTHER): Payer: Self-pay

## 2018-07-08 ENCOUNTER — Encounter: Payer: Self-pay | Admitting: Family Medicine

## 2018-08-30 ENCOUNTER — Encounter: Payer: Self-pay | Admitting: Family Medicine

## 2018-08-31 ENCOUNTER — Ambulatory Visit: Payer: Commercial Managed Care - PPO | Admitting: Nurse Practitioner

## 2018-08-31 ENCOUNTER — Encounter: Payer: Self-pay | Admitting: Nurse Practitioner

## 2018-08-31 VITALS — BP 118/90 | HR 82 | Temp 97.8°F | Ht 64.25 in | Wt 260.8 lb

## 2018-08-31 DIAGNOSIS — J209 Acute bronchitis, unspecified: Secondary | ICD-10-CM | POA: Diagnosis not present

## 2018-08-31 DIAGNOSIS — J0140 Acute pansinusitis, unspecified: Secondary | ICD-10-CM | POA: Diagnosis not present

## 2018-08-31 MED ORDER — SALINE SPRAY 0.65 % NA SOLN: 1.0000 | NASAL | 0 refills | Status: AC | PRN

## 2018-08-31 MED ORDER — FLUTICASONE PROPIONATE 50 MCG/ACT NA SUSP: 2.0000 | Freq: Every day | NASAL | 0 refills | Status: AC

## 2018-08-31 MED ORDER — DM-GUAIFENESIN ER 30-600 MG PO TB12: 1.0000 | ORAL_TABLET | Freq: Two times a day (BID) | ORAL | 0 refills | Status: DC | PRN

## 2018-08-31 MED ORDER — HYDROCODONE-HOMATROPINE 5-1.5 MG/5ML PO SYRP: 5.0000 mL | ORAL_SOLUTION | Freq: Two times a day (BID) | ORAL | 0 refills | Status: DC | PRN

## 2018-08-31 MED ORDER — AMOXICILLIN-POT CLAVULANATE 875-125 MG PO TABS: 1.0000 | ORAL_TABLET | Freq: Two times a day (BID) | ORAL | 0 refills | Status: DC

## 2018-08-31 MED ORDER — BENZONATATE 100 MG PO CAPS: 100.0000 mg | ORAL_CAPSULE | Freq: Three times a day (TID) | ORAL | 0 refills | Status: DC | PRN

## 2018-08-31 NOTE — Progress Notes (Signed)
Subjective:  Patient ID: Shirley Garcia, female    DOB: March 14, 1976  Age: 42 y.o. MRN: [id number]  CC: Cough (low grade fever started yesterday, aching, headache, weak, productive cough mucus was green now yellow / started x5 days/ OTC robutussin and musinex )   Cough  This is a new problem. The current episode started in the past 7 days. The problem has been unchanged. The cough is productive of purulent sputum. Associated symptoms include chills, a fever, myalgias, nasal congestion, postnasal drip, rhinorrhea, a sore throat and shortness of breath. Pertinent negatives include no wheezing. The symptoms are aggravated by lying down. Risk factors for lung disease include occupational exposure. She has tried OTC cough suppressant for the symptoms. The treatment provided no relief.  benzonatate used in past with no relief  Reviewed past Medical, Social and Family history today.  Outpatient Medications Prior to Visit  Medication Sig Dispense Refill  . silver sulfADIAZINE (SILVADENE) 1 % cream Apply 1 application topically daily. (Patient not taking: Reported on 04/27/2018) 50 g 0   No facility-administered medications prior to visit.     ROS See HPI  Objective:  BP 118/90   Pulse 82   Temp 97.8 F (36.6 C) (Oral)   Ht 5' 4.25" (1.632 m)   Wt 260 lb 12.8 oz (118.3 kg)   SpO2 100%   BMI 44.42 kg/m   BP Readings from Last 3 Encounters:  08/31/18 118/90  04/27/18 118/80  10/14/17 122/74    Wt Readings from Last 3 Encounters:  08/31/18 260 lb 12.8 oz (118.3 kg)  04/27/18 251 lb 3.2 oz (113.9 kg)  10/14/17 255 lb 4 oz (115.8 kg)    Physical Exam Vitals signs reviewed.  HENT:     Head:     Jaw: No trismus.     Right Ear: Tympanic membrane, ear canal and external ear normal.     Left Ear: Tympanic membrane, ear canal and external ear normal.     Nose: Mucosal edema and rhinorrhea present.     Right Sinus: Maxillary sinus tenderness present. No frontal sinus tenderness.   Left Sinus: Maxillary sinus tenderness present. No frontal sinus tenderness.     Mouth/Throat:     Pharynx: Uvula midline. Posterior oropharyngeal erythema present. No oropharyngeal exudate.  Eyes:     General: No scleral icterus. Neck:     Musculoskeletal: Normal range of motion and neck supple.  Cardiovascular:     Rate and Rhythm: Normal rate.     Heart sounds: Normal heart sounds.  Pulmonary:     Effort: Pulmonary effort is normal.     Breath sounds: Normal breath sounds.  Lymphadenopathy:     Cervical: Cervical adenopathy present.  Neurological:     Mental Status: She is alert and oriented to person, place, and time.     Lab Results  Component Value Date   WBC 6.1 04/30/2017   HGB 12.2 04/30/2017   HCT 37.5 04/30/2017   PLT 350.0 04/30/2017   GLUCOSE 94 04/27/2018   CHOL 121 04/27/2018   TRIG 83.0 04/27/2018   HDL 60.80 04/27/2018   LDLCALC 43 04/27/2018   ALT 12 04/27/2018   AST 13 04/27/2018   NA 140 04/27/2018   K 3.9 04/27/2018   CL 105 04/27/2018   CREATININE 0.70 04/27/2018   BUN 13 04/27/2018   CO2 29 04/27/2018   TSH 0.88 04/27/2018   HGBA1C 6.1 08/18/2012    Mm 3d Screen Breast Bilateral  Result Date: 05/25/2018  CLINICAL DATA:  Screening. EXAM: DIGITAL SCREENING BILATERAL MAMMOGRAM WITH TOMO AND CAD COMPARISON:  Previous exam(s). ACR Breast Density Category b: There are scattered areas of fibroglandular density. FINDINGS: There are no findings suspicious for malignancy. Images were processed with CAD. IMPRESSION: No mammographic evidence of malignancy. A result letter of this screening mammogram will be mailed directly to the patient. RECOMMENDATION: Screening mammogram in one year. (Code:SM-B-01Y) BI-RADS CATEGORY  1: Negative. Electronically Signed   By: Ammie Ferrier M.D.   On: 05/25/2018 09:59    Assessment & Plan:   Everlee was seen today for cough.  Diagnoses and all orders for this visit:  Acute non-recurrent pansinusitis -      dextromethorphan-guaiFENesin (MUCINEX DM) 30-600 MG 12hr tablet; Take 1 tablet by mouth 2 (two) times daily as needed for cough. -     sodium chloride (OCEAN) 0.65 % SOLN nasal spray; Place 1 spray into both nostrils as needed for congestion. -     fluticasone (FLONASE) 50 MCG/ACT nasal spray; Place 2 sprays into both nostrils daily. -     amoxicillin-clavulanate (AUGMENTIN) 875-125 MG tablet; Take 1 tablet by mouth 2 (two) times daily.  Acute bronchitis, unspecified organism -     dextromethorphan-guaiFENesin (MUCINEX DM) 30-600 MG 12hr tablet; Take 1 tablet by mouth 2 (two) times daily as needed for cough. -     sodium chloride (OCEAN) 0.65 % SOLN nasal spray; Place 1 spray into both nostrils as needed for congestion. -     fluticasone (FLONASE) 50 MCG/ACT nasal spray; Place 2 sprays into both nostrils daily. -     amoxicillin-clavulanate (AUGMENTIN) 875-125 MG tablet; Take 1 tablet by mouth 2 (two) times daily. -     Discontinue: benzonatate (TESSALON) 100 MG capsule; Take 1-2 capsules (100-200 mg total) by mouth 3 (three) times daily as needed for cough. -     HYDROcodone-homatropine (HYCODAN) 5-1.5 MG/5ML syrup; Take 5 mLs by mouth every 12 (twelve) hours as needed for cough.   I have discontinued Chicquita T. Loiseau's benzonatate. I am also having her start on dextromethorphan-guaiFENesin, sodium chloride, fluticasone, amoxicillin-clavulanate, and HYDROcodone-homatropine. Additionally, I am having her maintain her silver sulfADIAZINE.  Meds ordered this encounter  Medications  . dextromethorphan-guaiFENesin (MUCINEX DM) 30-600 MG 12hr tablet    Sig: Take 1 tablet by mouth 2 (two) times daily as needed for cough.    Dispense:  14 tablet    Refill:  0    Order Specific Question:   Supervising Provider    Answer:   Lucille Passy [[id number]]  . sodium chloride (OCEAN) 0.65 % SOLN nasal spray    Sig: Place 1 spray into both nostrils as needed for congestion.    Dispense:  15 mL    Refill:  0     Order Specific Question:   Supervising Provider    Answer:   Lucille Passy [[id number]]  . fluticasone (FLONASE) 50 MCG/ACT nasal spray    Sig: Place 2 sprays into both nostrils daily.    Dispense:  16 g    Refill:  0    Order Specific Question:   Supervising Provider    Answer:   Lucille Passy [[id number]]  . amoxicillin-clavulanate (AUGMENTIN) 875-125 MG tablet    Sig: Take 1 tablet by mouth 2 (two) times daily.    Dispense:  14 tablet    Refill:  0    Order Specific Question:   Supervising Provider    Answer:   Lucille Passy [  [id number]]  . DISCONTD: benzonatate (TESSALON) 100 MG capsule    Sig: Take 1-2 capsules (100-200 mg total) by mouth 3 (three) times daily as needed for cough.    Dispense:  20 capsule    Refill:  0    Order Specific Question:   Supervising Provider    Answer:   Lucille Passy [[id number]]  . HYDROcodone-homatropine (HYCODAN) 5-1.5 MG/5ML syrup    Sig: Take 5 mLs by mouth every 12 (twelve) hours as needed for cough.    Dispense:  60 mL    Refill:  0    Order Specific Question:   Supervising Provider    Answer:   Lucille Passy [[id number]]    Problem List Items Addressed This Visit    None    Visit Diagnoses    Acute non-recurrent pansinusitis    -  Primary   Relevant Medications   dextromethorphan-guaiFENesin (MUCINEX DM) 30-600 MG 12hr tablet   sodium chloride (OCEAN) 0.65 % SOLN nasal spray   fluticasone (FLONASE) 50 MCG/ACT nasal spray   amoxicillin-clavulanate (AUGMENTIN) 875-125 MG tablet   HYDROcodone-homatropine (HYCODAN) 5-1.5 MG/5ML syrup   Acute bronchitis, unspecified organism       Relevant Medications   dextromethorphan-guaiFENesin (MUCINEX DM) 30-600 MG 12hr tablet   sodium chloride (OCEAN) 0.65 % SOLN nasal spray   fluticasone (FLONASE) 50 MCG/ACT nasal spray   amoxicillin-clavulanate (AUGMENTIN) 875-125 MG tablet   HYDROcodone-homatropine (HYCODAN) 5-1.5 MG/5ML syrup       Follow-up: No follow-ups on file.  Wilfred Lacy, NP

## 2018-08-31 NOTE — Patient Instructions (Signed)
Maintain adequate oral hydration.   Sinusitis, Adult Sinusitis is soreness and swelling (inflammation) of your sinuses. Sinuses are hollow spaces in the bones around your face. They are located:  Around your eyes.  In the middle of your forehead.  Behind your nose.  In your cheekbones. Your sinuses and nasal passages are lined with a fluid called mucus. Mucus drains out of your sinuses. Swelling can trap mucus in your sinuses. This lets germs (bacteria, virus, or fungus) grow, which leads to infection. Most of the time, this condition is caused by a virus. What are the causes? This condition is caused by:  Allergies.  Asthma.  Germs.  Things that block your nose or sinuses.  Growths in the nose (nasal polyps).  Chemicals or irritants in the air.  Fungus (rare). What increases the risk? You are more likely to develop this condition if:  You have a weak body defense system (immune system).  You do a lot of swimming or diving.  You use nasal sprays too much.  You smoke. What are the signs or symptoms? The main symptoms of this condition are pain and a feeling of pressure around the sinuses. Other symptoms include:  Stuffy nose (congestion).  Runny nose (drainage).  Swelling and warmth in the sinuses.  Headache.  Toothache.  A cough that may get worse at night.  Mucus that collects in the throat or the back of the nose (postnasal drip).  Being unable to smell and taste.  Being very tired (fatigue).  A fever.  Sore throat.  Bad breath. How is this diagnosed? This condition is diagnosed based on:  Your symptoms.  Your medical history.  A physical exam.  Tests to find out if your condition is short-term (acute) or long-term (chronic). Your doctor may: ? Check your nose for growths (polyps). ? Check your sinuses using a tool that has a light (endoscope). ? Check for allergies or germs. ? Do imaging tests, such as an MRI or CT scan. How is this  treated? Treatment for this condition depends on the cause and whether it is short-term or long-term.  If caused by a virus, your symptoms should go away on their own within 10 days. You may be given medicines to relieve symptoms. They include: ? Medicines that shrink swollen tissue in the nose. ? Medicines that treat allergies (antihistamines). ? A spray that treats swelling of the nostrils. ? Rinses that help get rid of thick mucus in your nose (nasal saline washes).  If caused by bacteria, your doctor may wait to see if you will get better without treatment. You may be given antibiotic medicine if you have: ? A very bad infection. ? A weak body defense system.  If caused by growths in the nose, you may need to have surgery. Follow these instructions at home: Medicines  Take, use, or apply over-the-counter and prescription medicines only as told by your doctor. These may include nasal sprays.  If you were prescribed an antibiotic medicine, take it as told by your doctor. Do not stop taking the antibiotic even if you start to feel better. Hydrate and humidify   Drink enough water to keep your pee (urine) pale yellow.  Use a cool mist humidifier to keep the humidity level in your home above 50%.  Breathe in steam for 10-15 minutes, 3-4 times a day, or as told by your doctor. You can do this in the bathroom while a hot shower is running.  Try not to spend  time in cool or dry air. Rest  Rest as much as you can.  Sleep with your head raised (elevated).  Make sure you get enough sleep each night. General instructions   Put a warm, moist washcloth on your face 3-4 times a day, or as often as told by your doctor. This will help with discomfort.  Wash your hands often with soap and water. If there is no soap and water, use hand sanitizer.  Do not smoke. Avoid being around people who are smoking (secondhand smoke).  Keep all follow-up visits as told by your doctor. This is  important. Contact a doctor if:  You have a fever.  Your symptoms get worse.  Your symptoms do not get better within 10 days. Get help right away if:  You have a very bad headache.  You cannot stop throwing up (vomiting).  You have very bad pain or swelling around your face or eyes.  You have trouble seeing.  You feel confused.  Your neck is stiff.  You have trouble breathing. Summary  Sinusitis is swelling of your sinuses. Sinuses are hollow spaces in the bones around your face.  This condition is caused by tissues in your nose that become inflamed or swollen. This traps germs. These can lead to infection.  If you were prescribed an antibiotic medicine, take it as told by your doctor. Do not stop taking it even if you start to feel better.  Keep all follow-up visits as told by your doctor. This is important. This information is not intended to replace advice given to you by your health care provider. Make sure you discuss any questions you have with your health care provider. Document Released: 02/04/2008 Document Revised: 01/18/2018 Document Reviewed: 01/18/2018 Elsevier Interactive Patient Education  2019 [employer].

## 2019-03-02 ENCOUNTER — Encounter: Payer: Self-pay | Admitting: Family Medicine

## 2019-03-03 ENCOUNTER — Telehealth (INDEPENDENT_AMBULATORY_CARE_PROVIDER_SITE_OTHER): Payer: Commercial Managed Care - PPO | Admitting: Family Medicine

## 2019-03-03 ENCOUNTER — Encounter: Payer: Self-pay | Admitting: Family Medicine

## 2019-03-03 DIAGNOSIS — M25472 Effusion, left ankle: Secondary | ICD-10-CM | POA: Diagnosis not present

## 2019-03-03 DIAGNOSIS — R609 Edema, unspecified: Secondary | ICD-10-CM | POA: Diagnosis not present

## 2019-03-03 NOTE — Assessment & Plan Note (Signed)
>  15 minutes spent in face to face time with patient, >50% spent in counselling or coordination of care- seems more consistent with dependent edema, left > right.  Advised cutting back on salt, keeping legs elevated, wearing compression socks and supportive shoes at work. Call or send my chart message prn if these symptoms worsen or fail to improve as anticipated. The patient indicates understanding of these issues and agrees with the plan.

## 2019-03-03 NOTE — Progress Notes (Signed)
Virtual Visit via Video   Due to the COVID-19 pandemic, this visit was completed with telemedicine (audio/video) technology to reduce patient and provider exposure as well as to preserve personal protective equipment.   I connected with Shirley Garcia by a video enabled telemedicine application and verified that I am speaking with the correct person using two identifiers. Location patient: Home Location provider:  [facility], Office Persons participating in the virtual visit: Shirley Garcia, Shirley Norris, MD   I discussed the limitations of evaluation and management by telemedicine and the availability of in person appointments. The patient expressed understanding and agreed to proceed.  Care Team   Patient Care Team: Shirley Passy, MD as PCP - General (Family Medicine)  Subjective:   HPI:   Left ankle swelling- started over a week ago and has progressed.  Painful but when it's elevated, it helps.  Denies heat or injury.  She admits to being on her feet more at work and wearing croc instead of tennis shoes.  Today since she hasn't worked and kept her legs elevated, already much better.  No erythema or pain.  No SOB or CP.  Review of Systems  Constitutional: Negative.   HENT: Negative.   Eyes: Negative.   Respiratory: Negative for shortness of breath.   Cardiovascular: Positive for leg swelling. Negative for chest pain.  Gastrointestinal: Negative.   Genitourinary: Negative.   Musculoskeletal: Negative.   Skin: Negative.   Neurological: Negative.   Endo/Heme/Allergies: Negative.   Psychiatric/Behavioral: Negative.   All other systems reviewed and are negative.    Patient Active Problem List   Diagnosis Date Noted  . Left ankle swelling 03/03/2019  . PCOS (polycystic ovarian syndrome) 04/27/2018  . Obesity 04/27/2018    Social History   Tobacco Use  . Smoking status: Never Smoker  . Smokeless tobacco: Never Used  Substance Use Topics  . Alcohol use: No   Alcohol/week: 0.0 standard drinks    Current Outpatient Medications:  .  fluticasone (FLONASE) 50 MCG/ACT nasal spray, Place 2 sprays into both nostrils daily., Disp: 16 g, Rfl: 0 .  sodium chloride (OCEAN) 0.65 % SOLN nasal spray, Place 1 spray into both nostrils as needed for congestion., Disp: 15 mL, Rfl: 0 .  silver sulfADIAZINE (SILVADENE) 1 % cream, Apply 1 application topically daily. (Patient not taking: Reported on 04/27/2018), Disp: 50 g, Rfl: 0  No Known Allergies  Objective:  There were no vitals taken for this visit.  VITALS: Per patient if applicable, see vitals. GENERAL: Alert, appears well and in no acute distress. HEENT: Atraumatic, conjunctiva clear, no obvious abnormalities on inspection of external nose and ears. NECK: Normal movements of the head and neck. CARDIOPULMONARY: No increased WOB. Speaking in clear sentences. I:E ratio WNL.  MS: Moves all visible extremities without noticeable abnormality. Minimal pitting edema visible on video when patient pressures on both ankles PSYCH: Pleasant and cooperative, well-groomed. Speech normal rate and rhythm. Affect is appropriate. Insight and judgement are appropriate. Attention is focused, linear, and appropriate.  NEURO: CN grossly intact. Oriented as arrived to appointment on time with no prompting. Moves both UE equally.  SKIN: No obvious lesions, wounds, erythema, or cyanosis noted on face or hands.  Depression screen [facility] 2/9 05/05/2017 04/03/2015  Decreased Interest 1 0  Down, Depressed, Hopeless 0 0  PHQ - 2 Score 1 0  Altered sleeping 1 -  Tired, decreased energy 1 -  Change in appetite 2 -  Feeling bad or failure  about yourself  1 -  Trouble concentrating 0 -  Moving slowly or fidgety/restless 0 -  Suicidal thoughts 0 -  PHQ-9 Score 6 -    Assessment and Plan:   [name] was seen today for joint swelling.  Diagnoses and all orders for this visit:  Left ankle swelling    . COVID-19 Education: The signs  and symptoms of COVID-19 were discussed with the patient and how to seek care for testing if needed. The importance of social distancing was discussed today. . Reviewed expectations re: course of current medical issues. . Discussed self-management of symptoms. . Outlined signs and symptoms indicating need for more acute intervention. . Patient verbalized understanding and all questions were answered. Marland Kitchen Health Maintenance issues including appropriate healthy diet, exercise, and smoking avoidance were discussed with patient. . See orders for this visit as documented in the electronic medical record.  Shirley Norris, MD  Records requested if needed. Time spent: 15  minutes, of which >50% was spent in obtaining information about her symptoms, reviewing her previous labs, evaluations, and treatments, counseling her about her condition (please see the discussed topics above), and developing a plan to further investigate it; she had a number of questions which I addressed.

## 2019-04-30 NOTE — Progress Notes (Signed)
Subjective:    Patient ID: Shirley Garcia, female    DOB: 08-23-1976, 43 y.o.   MRN: [id number]  HPI  43 yo pleasant G2P2 here for CPX and follow up of chronic medical conditions.  No h/o abnormal pap smears.  Last pap smear was 05/05/17- done by me. No known family history of cervical or ovarian CA.  Last mammogram 05/05/18.   Using condoms for contraception.  Obesity- BMI over 42.  She has tried every diet.  She feels her PCOS makes it worse.  Has tired "ecvery diet." She is a night time snacker.    Wt Readings from Last 3 Encounters:  05/02/19 257 lb 6.4 oz (116.8 kg)  08/31/18 260 lb 12.8 oz (118.3 kg)  04/27/18 251 lb 3.2 oz (113.9 kg)     Lab Results  Component Value Date   WBC 6.1 04/30/2017   HGB 12.2 04/30/2017   HCT 37.5 04/30/2017   MCV 89.4 04/30/2017   PLT 350.0 04/30/2017    Lab Results  Component Value Date   CREATININE 0.70 04/27/2018   Lab Results  Component Value Date   CHOL 121 04/27/2018   HDL 60.80 04/27/2018   LDLCALC 43 04/27/2018   TRIG 83.0 04/27/2018   CHOLHDL 2 04/27/2018   Lab Results  Component Value Date   ALT 12 04/27/2018   AST 13 04/27/2018   ALKPHOS 68 04/27/2018   BILITOT 0.4 04/27/2018   Lab Results  Component Value Date   TSH 0.88 04/27/2018   Lab Results  Component Value Date   WBC 6.1 04/30/2017   HGB 12.2 04/30/2017   HCT 37.5 04/30/2017   MCV 89.4 04/30/2017   PLT 350.0 04/30/2017     Patient Active Problem List   Diagnosis Date Noted  . Well woman exam without gynecological exam 05/02/2019  . Class 3 severe obesity due to excess calories without serious comorbidity with body mass index (BMI) of 40.0 to 44.9 in adult [facility]) 04/30/2019  . Dependent edema 03/03/2019  . PCOS (polycystic ovarian syndrome) 04/27/2018  . Obesity 04/27/2018   Past Medical History:  Diagnosis Date  . Depression   . H/O cervical polypectomy 2008  . History of PCOS   . Ovarian cyst   . PCOS (polycystic ovarian syndrome)     Past Surgical History:  Procedure Laterality Date  . CERVICAL CERCLAGE    . cervix polpys    . CESAREAN SECTION     x1  . CYSTECTOMY Bilateral 1996  . LAPAROSCOPIC LYSIS OF ADHESIONS  09/17/2015   Procedure: LAPAROSCOPIC LYSIS OF ADHESIONS;  Surgeon: Rubie Maid, MD;  Location: [facility] ORS;  Service: Gynecology;;  . LAPAROSCOPIC SALPINGO OOPHERECTOMY Right 09/17/2015   Procedure: LAPAROSCOPIC SALPINGO OOPHORECTOMY;  Surgeon: Rubie Maid, MD;  Location: [facility] ORS;  Service: Gynecology;  Laterality: Right;   Social History   Tobacco Use  . Smoking status: Never Smoker  . Smokeless tobacco: Never Used  Substance Use Topics  . Alcohol use: No    Alcohol/week: 0.0 standard drinks  . Drug use: No   Family History  Problem Relation Age of Onset  . Hypertension Mother   . Hypertension Sister   . Diabetes Maternal Grandfather   . Breast cancer Neg Hx    No Known Allergies Current Outpatient Medications on File Prior to Visit  Medication Sig Dispense Refill  . fluticasone (FLONASE) 50 MCG/ACT nasal spray Place 2 sprays into both nostrils daily. 16 g 0  . sodium chloride (OCEAN) 0.65 % SOLN  nasal spray Place 1 spray into both nostrils as needed for congestion. 15 mL 0   No current facility-administered medications on file prior to visit.    The PMH, PSH, Social History, Family History, Medications, and allergies have been reviewed in [facility], and have been updated if relevant.   Review of Systems  Constitutional: Negative.   HENT: Negative.   Eyes: Negative.   Respiratory: Negative.   Cardiovascular: Negative.   Gastrointestinal: Negative.   Endocrine: Negative.   Genitourinary: Negative for menstrual problem.  Musculoskeletal: Negative.   Skin: Negative.   Allergic/Immunologic: Negative.   Neurological: Negative.   Hematological: Negative.   Psychiatric/Behavioral: Negative.   All other systems reviewed and are negative.      Objective:   Physical Exam BP 128/80 (BP  Location: Left Arm, Patient Position: Sitting, Cuff Size: Large)   Pulse 63   Temp 98.3 F (36.8 C) (Oral)   Ht 5\' 5"  (1.651 m)   Wt 257 lb 6.4 oz (116.8 kg)   LMP 03/16/2019   SpO2 100%   BMI 42.83 kg/m   General:  Well-developed,obese, n no acute distress; alert,appropriate and cooperative throughout examination Head:  normocephalic and atraumatic.   Eyes:  vision grossly intact, PERRL Ears:  R ear normal and L ear normal externally, TMs clear bilaterally Nose:  no external deformity.   Mouth:  good dentition.   Neck:  No deformities, masses, or tenderness noted. Breasts:  No mass, nodules, thickening, tenderness, bulging, retraction, inflamation, nipple discharge or skin changes noted.   Lungs:  Normal respiratory effort, chest expands symmetrically. Lungs are clear to auscultation, no crackles or wheezes. Heart:  Normal rate and regular rhythm. S1 and S2 normal without gallop, murmur, click, rub or other extra sounds. Abdomen:  Bowel sounds positive,abdomen soft and non-tender without masses, organomegaly or hernias noted. Msk:  No deformity or scoliosis noted of thoracic or lumbar spine.   Extremities:  No clubbing, cyanosis, edema, or deformity noted with normal full range of motion of all joints.   Neurologic:  alert & oriented X3 and gait normal.   Skin:  Intact without suspicious lesions or rashes Cervical Nodes:  No lymphadenopathy noted Axillary Nodes:  No palpable lymphadenopathy Psych:  Cognition and judgment appear intact. Alert and cooperative with normal attention span and concentration. No apparent delusions, illusions, hallucinations      Assessment & Plan:

## 2019-05-02 ENCOUNTER — Encounter: Payer: Self-pay | Admitting: Family Medicine

## 2019-05-02 ENCOUNTER — Ambulatory Visit (INDEPENDENT_AMBULATORY_CARE_PROVIDER_SITE_OTHER): Payer: Commercial Managed Care - PPO | Admitting: Family Medicine

## 2019-05-02 VITALS — BP 128/80 | HR 63 | Temp 98.3°F | Ht 65.0 in | Wt 257.4 lb

## 2019-05-02 DIAGNOSIS — Z0000 Encounter for general adult medical examination without abnormal findings: Secondary | ICD-10-CM | POA: Diagnosis not present

## 2019-05-02 DIAGNOSIS — Z1239 Encounter for other screening for malignant neoplasm of breast: Secondary | ICD-10-CM | POA: Diagnosis not present

## 2019-05-02 DIAGNOSIS — E282 Polycystic ovarian syndrome: Secondary | ICD-10-CM

## 2019-05-02 DIAGNOSIS — Z6841 Body Mass Index (BMI) 40.0 and over, adult: Secondary | ICD-10-CM

## 2019-05-02 DIAGNOSIS — Z862 Personal history of diseases of the blood and blood-forming organs and certain disorders involving the immune mechanism: Secondary | ICD-10-CM | POA: Diagnosis not present

## 2019-05-02 DIAGNOSIS — E66813 Obesity, class 3: Secondary | ICD-10-CM

## 2019-05-02 LAB — CBC WITH DIFFERENTIAL/PLATELET
Basophils Absolute: 0.1 10*3/uL (ref 0.0–0.1)
Basophils Relative: 0.9 % (ref 0.0–3.0)
Eosinophils Absolute: 0.7 10*3/uL (ref 0.0–0.7)
Eosinophils Relative: 10.9 % — ABNORMAL HIGH (ref 0.0–5.0)
HCT: 36.1 % (ref 36.0–46.0)
Hemoglobin: 11.6 g/dL — ABNORMAL LOW (ref 12.0–15.0)
Lymphocytes Relative: 44.1 % (ref 12.0–46.0)
Lymphs Abs: 2.9 10*3/uL (ref 0.7–4.0)
MCHC: 32.1 g/dL (ref 30.0–36.0)
MCV: 83.1 fl (ref 78.0–100.0)
Monocytes Absolute: 0.3 10*3/uL (ref 0.1–1.0)
Monocytes Relative: 5 % (ref 3.0–12.0)
Neutro Abs: 2.5 10*3/uL (ref 1.4–7.7)
Neutrophils Relative %: 39.1 % — ABNORMAL LOW (ref 43.0–77.0)
Platelets: 337 10*3/uL (ref 150.0–400.0)
RBC: 4.35 Mil/uL (ref 3.87–5.11)
RDW: 16.1 % — ABNORMAL HIGH (ref 11.5–15.5)
WBC: 6.5 10*3/uL (ref 4.0–10.5)

## 2019-05-02 LAB — HEMOGLOBIN A1C: Hgb A1c MFr Bld: 6.2 % (ref 4.6–6.5)

## 2019-05-02 LAB — LIPID PANEL
Cholesterol: 118 mg/dL (ref 0–200)
HDL: 63.8 mg/dL (ref >39.00)
LDL Cholesterol: 38 mg/dL (ref 0–99)
NonHDL: 54.56
Total CHOL/HDL Ratio: 2
Triglycerides: 82 mg/dL (ref 0.0–149.0)
VLDL: 16.4 mg/dL (ref 0.0–40.0)

## 2019-05-02 LAB — COMPREHENSIVE METABOLIC PANEL
ALT: 12 U/L (ref 0–35)
AST: 13 U/L (ref 0–37)
Albumin: 4.1 g/dL (ref 3.5–5.2)
Alkaline Phosphatase: 65 U/L (ref 39–117)
BUN: 14 mg/dL (ref 6–23)
CO2: 26 mEq/L (ref 19–32)
Calcium: 9.1 mg/dL (ref 8.4–10.5)
Chloride: 106 mEq/L (ref 96–112)
Creatinine, Ser: 0.73 mg/dL (ref 0.40–1.20)
GFR: 105.65 mL/min (ref >60.00)
Glucose, Bld: 95 mg/dL (ref 70–99)
Potassium: 3.9 mEq/L (ref 3.5–5.1)
Sodium: 138 mEq/L (ref 135–145)
Total Bilirubin: 0.4 mg/dL (ref 0.2–1.2)
Total Protein: 7 g/dL (ref 6.0–8.3)

## 2019-05-02 LAB — TSH: TSH: 1.84 u[IU]/mL (ref 0.35–4.50)

## 2019-05-02 LAB — FERRITIN: Ferritin: 6 ng/mL — ABNORMAL LOW (ref 10.0–291.0)

## 2019-05-02 MED ORDER — PHENTERMINE HCL 15 MG PO CAPS: 15.0000 mg | ORAL_CAPSULE | Freq: Every morning | ORAL | 0 refills | Status: DC

## 2019-05-02 NOTE — Patient Instructions (Addendum)
Great to see you!  I will call you with your lab results from today and you can view them online.    Please call [facility] to schedule your mammogram at [phone]   :)  We are starting phentermine 15 daily at 10 am.  We are also referring you to a nutritionist.

## 2019-05-02 NOTE — Assessment & Plan Note (Signed)
Total time spent with patient was 45 minutes, with 25 minutes spent specifically on problem visit obesity. Discussed weight loss plan. She would like to work with nutritionist - referral placed. Pt would also like to discuss medication options- discussed phentermine risk benefits, side effects including HTN, pulmonary HTN, stroke.    She would like to start phentermine and lifestyle changes.  eRx for phentermine 15 mg daily around 10 am. 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  Follow up in 1 month.  If BMI < 27 will decrease to half dose x 1 month then stop

## 2019-05-02 NOTE — Assessment & Plan Note (Signed)
Check a1c- ? Metformin would help with weight loss.

## 2019-05-02 NOTE — Assessment & Plan Note (Signed)
Reviewed preventive care protocols, scheduled due services, and updated immunizations Discussed nutrition, exercise, diet, and healthy lifestyle.  Mammogram ordered and phone number for breast center given to pt to schedule her own mammogram. 

## 2019-05-03 ENCOUNTER — Encounter: Payer: Self-pay | Admitting: Family Medicine

## 2019-05-04 ENCOUNTER — Other Ambulatory Visit: Payer: Self-pay | Admitting: Family Medicine

## 2019-05-04 MED ORDER — FERROUS SULFATE 325 (65 FE) MG PO TBEC: 325.0000 mg | DELAYED_RELEASE_TABLET | Freq: Every day | ORAL | 3 refills | Status: DC

## 2019-06-01 ENCOUNTER — Ambulatory Visit: Payer: Commercial Managed Care - PPO | Admitting: Family Medicine

## 2019-06-01 ENCOUNTER — Telehealth: Payer: Self-pay | Admitting: Family Medicine

## 2019-06-01 ENCOUNTER — Other Ambulatory Visit: Payer: Self-pay | Admitting: Family Medicine

## 2019-06-01 ENCOUNTER — Encounter: Payer: Self-pay | Admitting: Family Medicine

## 2019-06-01 MED ORDER — PHENTERMINE HCL 30 MG PO CAPS: 30.0000 mg | ORAL_CAPSULE | Freq: Every morning | ORAL | 2 refills | Status: DC

## 2019-06-01 NOTE — Telephone Encounter (Signed)
Called patient to reschedule 8:00 appointment at 7:31 a.m.  Left message on recorder.  Advised patient staff will call back today to reschedule.

## 2019-06-03 ENCOUNTER — Ambulatory Visit
Admission: RE | Admit: 2019-06-03 | Discharge: 2019-06-03 | Disposition: A | Discharge status: Home / Self Care | Payer: Commercial Managed Care - PPO | Source: Ambulatory Visit | Attending: Family Medicine | Admitting: Family Medicine

## 2019-06-03 DIAGNOSIS — Z1231 Encounter for screening mammogram for malignant neoplasm of breast: Secondary | ICD-10-CM | POA: Insufficient documentation

## 2019-06-03 DIAGNOSIS — Z1239 Encounter for other screening for malignant neoplasm of breast: Secondary | ICD-10-CM

## 2019-06-06 ENCOUNTER — Encounter: Payer: Self-pay | Admitting: Family Medicine

## 2019-07-07 ENCOUNTER — Other Ambulatory Visit (INDEPENDENT_AMBULATORY_CARE_PROVIDER_SITE_OTHER): Payer: Commercial Managed Care - PPO

## 2019-07-07 ENCOUNTER — Other Ambulatory Visit: Payer: Self-pay

## 2019-07-07 DIAGNOSIS — Z862 Personal history of diseases of the blood and blood-forming organs and certain disorders involving the immune mechanism: Secondary | ICD-10-CM | POA: Diagnosis not present

## 2019-07-07 DIAGNOSIS — E282 Polycystic ovarian syndrome: Secondary | ICD-10-CM | POA: Diagnosis not present

## 2019-07-07 LAB — CBC WITH DIFFERENTIAL/PLATELET
Basophils Absolute: 0.1 10*3/uL (ref 0.0–0.1)
Basophils Relative: 1.3 % (ref 0.0–3.0)
Eosinophils Absolute: 0.7 10*3/uL (ref 0.0–0.7)
Eosinophils Relative: 13 % — ABNORMAL HIGH (ref 0.0–5.0)
HCT: 36.6 % (ref 36.0–46.0)
Hemoglobin: 11.6 g/dL — ABNORMAL LOW (ref 12.0–15.0)
Lymphocytes Relative: 47.9 % — ABNORMAL HIGH (ref 12.0–46.0)
Lymphs Abs: 2.6 10*3/uL (ref 0.7–4.0)
MCHC: 31.7 g/dL (ref 30.0–36.0)
MCV: 84.3 fl (ref 78.0–100.0)
Monocytes Absolute: 0.4 10*3/uL (ref 0.1–1.0)
Monocytes Relative: 6.9 % (ref 3.0–12.0)
Neutro Abs: 1.7 10*3/uL (ref 1.4–7.7)
Neutrophils Relative %: 30.9 % — ABNORMAL LOW (ref 43.0–77.0)
Platelets: 409 10*3/uL — ABNORMAL HIGH (ref 150.0–400.0)
RBC: 4.34 Mil/uL (ref 3.87–5.11)
RDW: 16.7 % — ABNORMAL HIGH (ref 11.5–15.5)
WBC: 5.4 10*3/uL (ref 4.0–10.5)

## 2019-07-07 LAB — FERRITIN: Ferritin: 7.5 ng/mL — ABNORMAL LOW (ref 10.0–291.0)

## 2019-07-07 NOTE — Addendum Note (Signed)
Addended by: Lynnea Ferrier on: 07/07/2019 08:11 AM   Modules accepted: Orders

## 2019-07-08 ENCOUNTER — Encounter: Payer: Self-pay | Admitting: Family Medicine

## 2019-07-08 ENCOUNTER — Other Ambulatory Visit: Payer: Self-pay | Admitting: Family Medicine

## 2019-07-08 MED ORDER — FERROUS SULFATE 325 (65 FE) MG PO TBEC: 325.0000 mg | DELAYED_RELEASE_TABLET | Freq: Three times a day (TID) | ORAL | 3 refills | Status: DC

## 2019-07-14 ENCOUNTER — Encounter: Payer: Self-pay | Admitting: Family Medicine

## 2019-08-31 ENCOUNTER — Other Ambulatory Visit: Payer: Self-pay

## 2019-08-31 ENCOUNTER — Encounter: Payer: Self-pay | Admitting: Family Medicine

## 2019-08-31 DIAGNOSIS — Z6841 Body Mass Index (BMI) 40.0 and over, adult: Secondary | ICD-10-CM

## 2019-08-31 MED ORDER — PHENTERMINE HCL 30 MG PO CAPS: 30.0000 mg | ORAL_CAPSULE | Freq: Every morning | ORAL | 2 refills | Status: DC

## 2019-09-01 ENCOUNTER — Other Ambulatory Visit: Payer: Self-pay | Admitting: Family Medicine

## 2019-09-01 ENCOUNTER — Other Ambulatory Visit: Payer: Self-pay

## 2019-09-01 DIAGNOSIS — J0140 Acute pansinusitis, unspecified: Secondary | ICD-10-CM

## 2019-09-01 DIAGNOSIS — J209 Acute bronchitis, unspecified: Secondary | ICD-10-CM

## 2019-09-01 MED ORDER — PHENTERMINE HCL 30 MG PO CAPS: 30.0000 mg | ORAL_CAPSULE | Freq: Every morning | ORAL | 0 refills | Status: DC

## 2019-09-01 MED ORDER — PHENTERMINE HCL 30 MG PO CAPS: 30.0000 mg | ORAL_CAPSULE | Freq: Every morning | ORAL | 2 refills | Status: DC

## 2019-09-19 ENCOUNTER — Other Ambulatory Visit: Payer: Self-pay

## 2019-09-19 ENCOUNTER — Encounter: Payer: Self-pay | Admitting: Family Medicine

## 2019-09-19 MED ORDER — PHENTERMINE HCL 30 MG PO CAPS: 30.0000 mg | ORAL_CAPSULE | Freq: Every morning | ORAL | 0 refills | Status: DC

## 2019-09-19 MED ORDER — FERROUS SULFATE 325 (65 FE) MG PO TBEC: 325.0000 mg | DELAYED_RELEASE_TABLET | Freq: Three times a day (TID) | ORAL | 3 refills | Status: AC

## 2019-09-19 NOTE — Telephone Encounter (Signed)
Last OV 05/02/19 Last fill Iron 07/08/19  #90/3 Last fill Phentermine 09/01/19  #30/0

## 2019-09-19 NOTE — Telephone Encounter (Signed)
I have the phentermine rx for you to sign so I can fax it to pharmacy.  Thank you.

## 2019-11-15 ENCOUNTER — Other Ambulatory Visit: Payer: Self-pay | Admitting: Family Medicine

## 2019-11-15 DIAGNOSIS — Z6841 Body Mass Index (BMI) 40.0 and over, adult: Secondary | ICD-10-CM

## 2019-11-16 NOTE — Telephone Encounter (Signed)
Requesting: Phentermine Contract: None UDS: None Last OV: 8.31.2020 Next OV: Not scheduled Last Refill: 2.17.2021   Please advise

## 2019-11-16 NOTE — Telephone Encounter (Signed)
Pt can schedule appt with whomever she will be seeing as her new PCP to discuss weight loss med

## 2019-11-16 NOTE — Telephone Encounter (Signed)
Phentermine is meant for short term use (3-4 mo). It appears this was started end of 04/2019 or early 05/2019 so pt should not be on this anymore. I do not feel comfortable refilling

## 2019-11-17 ENCOUNTER — Other Ambulatory Visit: Payer: Self-pay | Admitting: Family Medicine

## 2019-11-17 DIAGNOSIS — Z6841 Body Mass Index (BMI) 40.0 and over, adult: Secondary | ICD-10-CM

## 2019-11-25 ENCOUNTER — Ambulatory Visit: Payer: Commercial Managed Care - PPO | Admitting: Nurse Practitioner

## 2019-11-25 DIAGNOSIS — Z0289 Encounter for other administrative examinations: Secondary | ICD-10-CM

## 2020-05-01 ENCOUNTER — Other Ambulatory Visit: Payer: Self-pay

## 2020-05-02 ENCOUNTER — Encounter: Payer: Self-pay | Admitting: Nurse Practitioner

## 2020-05-02 ENCOUNTER — Ambulatory Visit (INDEPENDENT_AMBULATORY_CARE_PROVIDER_SITE_OTHER): Payer: Commercial Managed Care - PPO | Admitting: Nurse Practitioner

## 2020-05-02 VITALS — BP 120/76 | HR 80 | Temp 97.0°F | Ht 65.5 in | Wt 248.8 lb

## 2020-05-02 DIAGNOSIS — Z13220 Encounter for screening for lipoid disorders: Secondary | ICD-10-CM

## 2020-05-02 DIAGNOSIS — D500 Iron deficiency anemia secondary to blood loss (chronic): Secondary | ICD-10-CM | POA: Diagnosis not present

## 2020-05-02 DIAGNOSIS — R7303 Prediabetes: Secondary | ICD-10-CM | POA: Diagnosis not present

## 2020-05-02 DIAGNOSIS — Z136 Encounter for screening for cardiovascular disorders: Secondary | ICD-10-CM | POA: Diagnosis not present

## 2020-05-02 DIAGNOSIS — Z0000 Encounter for general adult medical examination without abnormal findings: Secondary | ICD-10-CM | POA: Diagnosis not present

## 2020-05-02 LAB — COMPREHENSIVE METABOLIC PANEL
ALT: 13 U/L (ref 0–35)
AST: 12 U/L (ref 0–37)
Albumin: 4.2 g/dL (ref 3.5–5.2)
Alkaline Phosphatase: 59 U/L (ref 39–117)
BUN: 12 mg/dL (ref 6–23)
CO2: 28 mEq/L (ref 19–32)
Calcium: 9.5 mg/dL (ref 8.4–10.5)
Chloride: 105 mEq/L (ref 96–112)
Creatinine, Ser: 0.58 mg/dL (ref 0.40–1.20)
GFR: 137.11 mL/min (ref >60.00)
Glucose, Bld: 92 mg/dL (ref 70–99)
Potassium: 4 mEq/L (ref 3.5–5.1)
Sodium: 138 mEq/L (ref 135–145)
Total Bilirubin: 0.3 mg/dL (ref 0.2–1.2)
Total Protein: 6.6 g/dL (ref 6.0–8.3)

## 2020-05-02 LAB — CBC WITH DIFFERENTIAL/PLATELET
Basophils Absolute: 0 10*3/uL (ref 0.0–0.1)
Basophils Relative: 0.8 % (ref 0.0–3.0)
Eosinophils Absolute: 0.3 10*3/uL (ref 0.0–0.7)
Eosinophils Relative: 5 % (ref 0.0–5.0)
HCT: 38.1 % (ref 36.0–46.0)
Hemoglobin: 12.6 g/dL (ref 12.0–15.0)
Lymphocytes Relative: 50.4 % — ABNORMAL HIGH (ref 12.0–46.0)
Lymphs Abs: 2.6 10*3/uL (ref 0.7–4.0)
MCHC: 33.1 g/dL (ref 30.0–36.0)
MCV: 89 fl (ref 78.0–100.0)
Monocytes Absolute: 0.3 10*3/uL (ref 0.1–1.0)
Monocytes Relative: 6.2 % (ref 3.0–12.0)
Neutro Abs: 1.9 10*3/uL (ref 1.4–7.7)
Neutrophils Relative %: 37.6 % — ABNORMAL LOW (ref 43.0–77.0)
Platelets: 316 10*3/uL (ref 150.0–400.0)
RBC: 4.27 Mil/uL (ref 3.87–5.11)
RDW: 13.7 % (ref 11.5–15.5)
WBC: 5.2 10*3/uL (ref 4.0–10.5)

## 2020-05-02 LAB — LIPID PANEL
Cholesterol: 116 mg/dL (ref 0–200)
HDL: 62.3 mg/dL (ref >39.00)
LDL Cholesterol: 40 mg/dL (ref 0–99)
NonHDL: 53.59
Total CHOL/HDL Ratio: 2
Triglycerides: 66 mg/dL (ref 0.0–149.0)
VLDL: 13.2 mg/dL (ref 0.0–40.0)

## 2020-05-02 LAB — TSH: TSH: 1.48 u[IU]/mL (ref 0.35–4.50)

## 2020-05-02 LAB — HEMOGLOBIN A1C: Hgb A1c MFr Bld: 5.9 % (ref 4.6–6.5)

## 2020-05-02 NOTE — Patient Instructions (Addendum)
Go to lab for blood draw Schedule OV next week for breast and pelvic exam (at completion of menstrual cycle). You will be notified once your form is completed and ready for pick up. You are due for mammogram on or after 06/02/2020.  Health Maintenance, Female Adopting a healthy lifestyle and getting preventive care are important in promoting health and wellness. Ask your health care provider about:  The right schedule for you to have regular tests and exams.  Things you can do on your own to prevent diseases and keep yourself healthy. What should I know about diet, weight, and exercise? Eat a healthy diet   Eat a diet that includes plenty of vegetables, fruits, low-fat dairy products, and lean protein.  Do not eat a lot of foods that are high in solid fats, added sugars, or sodium. Maintain a healthy weight Body mass index (BMI) is used to identify weight problems. It estimates body fat based on height and weight. Your health care provider can help determine your BMI and help you achieve or maintain a healthy weight. Get regular exercise Get regular exercise. This is one of the most important things you can do for your health. Most adults should:  Exercise for at least 150 minutes each week. The exercise should increase your heart rate and make you sweat (moderate-intensity exercise).  Do strengthening exercises at least twice a week. This is in addition to the moderate-intensity exercise.  Spend less time sitting. Even light physical activity can be beneficial. Watch cholesterol and blood lipids Have your blood tested for lipids and cholesterol at 42 years of age, then have this test every 5 years. Have your cholesterol levels checked more often if:  Your lipid or cholesterol levels are high.  You are older than 43 years of age.  You are at high risk for heart disease. What should I know about cancer screening? Depending on your health history and family history, you may need  to have cancer screening at various ages. This may include screening for:  Breast cancer.  Cervical cancer.  Colorectal cancer.  Skin cancer.  Lung cancer. What should I know about heart disease, diabetes, and high blood pressure? Blood pressure and heart disease  High blood pressure causes heart disease and increases the risk of stroke. This is more likely to develop in people who have high blood pressure readings, are of African descent, or are overweight.  Have your blood pressure checked: ? Every 3-5 years if you are 104-58 years of age. ? Every year if you are 29 years old or older. Diabetes Have regular diabetes screenings. This checks your fasting blood sugar level. Have the screening done:  Once every three years after age 80 if you are at a normal weight and have a low risk for diabetes.  More often and at a younger age if you are overweight or have a high risk for diabetes. What should I know about preventing infection? Hepatitis B If you have a higher risk for hepatitis B, you should be screened for this virus. Talk with your health care provider to find out if you are at risk for hepatitis B infection. Hepatitis C Testing is recommended for:  Everyone born from [birth year] through 1964.  Anyone with known risk factors for hepatitis C. Sexually transmitted infections (STIs)  Get screened for STIs, including gonorrhea and chlamydia, if: ? You are sexually active and are younger than 43 years of age. ? You are older than 43 years of age  and your health care provider tells you that you are at risk for this type of infection. ? Your sexual activity has changed since you were last screened, and you are at increased risk for chlamydia or gonorrhea. Ask your health care provider if you are at risk.  Ask your health care provider about whether you are at high risk for HIV. Your health care provider may recommend a prescription medicine to help prevent HIV infection. If you choose  to take medicine to prevent HIV, you should first get tested for HIV. You should then be tested every 3 months for as long as you are taking the medicine. Pregnancy  If you are about to stop having your period (premenopausal) and you may become pregnant, seek counseling before you get pregnant.  Take 400 to 800 micrograms (mcg) of folic acid every day if you become pregnant.  Ask for birth control (contraception) if you want to prevent pregnancy. Osteoporosis and menopause Osteoporosis is a disease in which the bones lose minerals and strength with aging. This can result in bone fractures. If you are 17 years old or older, or if you are at risk for osteoporosis and fractures, ask your health care provider if you should:  Be screened for bone loss.  Take a calcium or vitamin D supplement to lower your risk of fractures.  Be given hormone replacement therapy (HRT) to treat symptoms of menopause. Follow these instructions at home: Lifestyle  Do not use any products that contain nicotine or tobacco, such as cigarettes, e-cigarettes, and chewing tobacco. If you need help quitting, ask your health care provider.  Do not use street drugs.  Do not share needles.  Ask your health care provider for help if you need support or information about quitting drugs. Alcohol use  Do not drink alcohol if: ? Your health care provider tells you not to drink. ? You are pregnant, may be pregnant, or are planning to become pregnant.  If you drink alcohol: ? Limit how much you use to 0-1 drink a day. ? Limit intake if you are breastfeeding.  Be aware of how much alcohol is in your drink. In the [place] one drink equals one 12 oz bottle of beer (355 mL), one 5 oz glass of wine (148 mL), or one 1 oz glass of hard liquor (44 mL). General instructions  Schedule regular health, dental, and eye exams.  Stay current with your vaccines.  Tell your health care provider if: ? You often feel depressed. ? You  have ever been abused or do not feel safe at home. Summary  Adopting a healthy lifestyle and getting preventive care are important in promoting health and wellness.  Follow your health care provider's instructions about healthy diet, exercising, and getting tested or screened for diseases.  Follow your health care provider's instructions on monitoring your cholesterol and blood pressure. This information is not intended to replace advice given to you by your health care provider. Make sure you discuss any questions you have with your health care provider. Document Revised: 08/11/2018 Document Reviewed: 08/11/2018 Elsevier Patient Education  2020 [employer].

## 2020-05-02 NOTE — Progress Notes (Signed)
Subjective:    Patient ID: Shirley Garcia, female    DOB: 01-03-1976, 44 y.o.   MRN: [id number]  Patient presents today for CPE and eval of chronic conditions  HPI Iron deficiency anemia due to chronic blood loss Secondary to menorrhagia Reports she is unable tolerate high doses of ferrous sulfate  Repeat cbc and iron panel Advised to consider referral to hematology if cbc and iron panel continues to be very low.  Sexual History (orientation,birth control, marital status, STD):declines need for STD screen, declined breast and pelvic exam due to onset of menstrual cycle. LMP 05/02/2020, states she will schedule mammogram in [place]  Depression/Suicide: Depression screen [facility] 2/9 05/02/2020 05/05/2017 04/03/2015  Decreased Interest 2 1 0  Down, Depressed, Hopeless 1 0 0  PHQ - 2 Score 3 1 0  Altered sleeping 0 1 -  Tired, decreased energy 0 1 -  Change in appetite 1 2 -  Feeling bad or failure about yourself  0 1 -  Trouble concentrating 0 0 -  Moving slowly or fidgety/restless 0 0 -  Suicidal thoughts 0 0 -  PHQ-9 Score 4 6 -  Difficult doing work/chores Not difficult at all - -   GAD 7 : Generalized Anxiety Score 05/02/2020  Nervous, Anxious, on Edge 1  Control/stop worrying 0  Worry too much - different things 1  Trouble relaxing 1  Restless 0  Easily annoyed or irritable 1  Afraid - awful might happen 0  Total GAD 7 Score 4  Anxiety Difficulty Not difficult at all   Vision:not needed  Dental:up to date  Immunizations: (TDAP, Hep C screen, Pneumovax, Influenza, zoster)  Health Maintenance  Topic Date Due  . Flu Shot  Never done  . Pap Smear  05/05/2020  .  Hepatitis C: One time screening is recommended by Center for Disease Control  (CDC) for  adults born from [birth year] through 1964.   05/02/2021*  . Tetanus Vaccine  04/27/2028  . COVID-19 Vaccine  Completed  . HIV Screening  Completed  *Topic was postponed. The date shown is not the original due date.    Diet:regular.  Weight:  Wt Readings from Last 3 Encounters:  05/02/20 248 lb 12.8 oz (112.9 kg)  05/02/19 257 lb 6.4 oz (116.8 kg)  08/31/18 260 lb 12.8 oz (118.3 kg)   Fall Risk: Fall Risk  03/03/2019 05/05/2017  Falls in the past year? 0 No  Follow up Falls evaluation completed -   Medications and allergies reviewed with patient and updated if appropriate.  Patient Active Problem List   Diagnosis Date Noted  . Iron deficiency anemia due to chronic blood loss 05/02/2020  . Prediabetes 05/02/2020  . Well woman exam without gynecological exam 05/02/2019  . Class 3 severe obesity due to excess calories without serious comorbidity with body mass index (BMI) of 40.0 to 44.9 in adult [facility]) 04/30/2019  . PCOS (polycystic ovarian syndrome) 04/27/2018  . BMI 40.0-44.9, adult ([place]) 04/27/2018   Current Outpatient Medications on File Prior to Visit  Medication Sig Dispense Refill  . fluticasone (FLONASE) 50 MCG/ACT nasal spray Place 2 sprays into both nostrils daily. 16 g 0  . sodium chloride (OCEAN) 0.65 % SOLN nasal spray Place 1 spray into both nostrils as needed for congestion. 15 mL 0  . ferrous sulfate 325 (65 FE) MG EC tablet Take 1 tablet (325 mg total) by mouth 3 (three) times daily with meals. (Patient not taking: Reported on 05/02/2020) 90 tablet 3  No current facility-administered medications on file prior to visit.   Past Medical History:  Diagnosis Date  . Depression   . H/O cervical polypectomy 2008  . History of PCOS   . Ovarian cyst   . PCOS (polycystic ovarian syndrome)    Past Surgical History:  Procedure Laterality Date  . CERVICAL CERCLAGE    . cervix polpys    . CESAREAN SECTION     x1  . CYSTECTOMY Bilateral 1996  . LAPAROSCOPIC LYSIS OF ADHESIONS  09/17/2015   Procedure: LAPAROSCOPIC LYSIS OF ADHESIONS;  Surgeon: Rubie Maid, MD;  Location: [facility] ORS;  Service: Gynecology;;  . LAPAROSCOPIC SALPINGO OOPHERECTOMY Right 09/17/2015   Procedure: LAPAROSCOPIC  SALPINGO OOPHORECTOMY;  Surgeon: Rubie Maid, MD;  Location: [facility] ORS;  Service: Gynecology;  Laterality: Right;    Social History   Socioeconomic History  . Marital status: Single    Spouse name: Not on file  . Number of children: Not on file  . Years of education: Not on file  . Highest education level: Not on file  Occupational History  . Not on file  Tobacco Use  . Smoking status: Never Smoker  . Smokeless tobacco: Never Used  Vaping Use  . Vaping Use: Never used  Substance and Sexual Activity  . Alcohol use: No    Alcohol/week: 0.0 standard drinks  . Drug use: No  . Sexual activity: Never    Birth control/protection: None  Other Topics Concern  . Not on file  Social History Narrative   CNA at [employer].   G2P2   Social Determinants of Health   Financial Resource Strain:   . Difficulty of Paying Living Expenses: Not on file  Food Insecurity:   . Worried About Charity fundraiser in the Last Year: Not on file  . Ran Out of Food in the Last Year: Not on file  Transportation Needs:   . Lack of Transportation (Medical): Not on file  . Lack of Transportation (Non-Medical): Not on file  Physical Activity:   . Days of Exercise per Week: Not on file  . Minutes of Exercise per Session: Not on file  Stress:   . Feeling of Stress : Not on file  Social Connections:   . Frequency of Communication with Friends and Family: Not on file  . Frequency of Social Gatherings with Friends and Family: Not on file  . Attends Religious Services: Not on file  . Active Member of Clubs or Organizations: Not on file  . Attends Archivist Meetings: Not on file  . Marital Status: Not on file    Family History  Problem Relation Age of Onset  . Hypertension Mother   . Hypertension Sister   . Diabetes Maternal Grandfather   . Breast cancer Neg Hx         Review of Systems  Constitutional: Negative for fever, malaise/fatigue and weight loss.  HENT: Negative for  congestion and sore throat.   Eyes:       Negative for visual changes  Respiratory: Negative for cough and shortness of breath.   Cardiovascular: Negative for chest pain, palpitations and leg swelling.  Gastrointestinal: Negative for blood in stool, constipation, diarrhea and heartburn.  Genitourinary: Negative for dysuria, frequency and urgency.  Musculoskeletal: Negative for falls, joint pain and myalgias.  Skin: Negative for rash.  Neurological: Negative for dizziness, sensory change and headaches.  Endo/Heme/Allergies: Does not bruise/bleed easily.  Psychiatric/Behavioral: Negative for depression, substance abuse and suicidal ideas. The  patient is not nervous/anxious.     Objective:   Vitals:   05/02/20 1024  BP: 120/76  Pulse: 80  Temp: (!) 97 F (36.1 C)  SpO2: 98%    Body mass index is 40.77 kg/m.   Physical Examination:  Physical Exam Vitals reviewed.  Constitutional:      General: She is not in acute distress.    Appearance: She is obese.  HENT:     Right Ear: Tympanic membrane, ear canal and external ear normal.     Left Ear: Tympanic membrane, ear canal and external ear normal.     Nose: Nose normal.  Eyes:     General: No scleral icterus.    Extraocular Movements: Extraocular movements intact.     Conjunctiva/sclera: Conjunctivae normal.  Neck:     Thyroid: No thyromegaly.  Cardiovascular:     Rate and Rhythm: Normal rate and regular rhythm.     Pulses: Normal pulses.     Heart sounds: Normal heart sounds.  Pulmonary:     Effort: Pulmonary effort is normal.     Breath sounds: Normal breath sounds.  Chest:     Chest wall: No tenderness.  Abdominal:     General: Bowel sounds are normal. There is no distension.     Palpations: Abdomen is soft.     Tenderness: There is no abdominal tenderness.  Genitourinary:    Comments: Declined breast and pelvic exam Musculoskeletal:        General: No tenderness. Normal range of motion.     Cervical back:  Normal range of motion and neck supple.     Right lower leg: No edema.     Left lower leg: No edema.  Lymphadenopathy:     Cervical: No cervical adenopathy.  Skin:    General: Skin is warm and dry.  Neurological:     Mental Status: She is alert and oriented to person, place, and time.  Psychiatric:        Mood and Affect: Mood normal.        Behavior: Behavior normal.        Thought Content: Thought content normal.    ASSESSMENT and PLAN: This visit occurred during the SARS-CoV-2 public health emergency.  Safety protocols were in place, including screening questions prior to the visit, additional usage of staff PPE, and extensive cleaning of exam room while observing appropriate contact time as indicated for disinfecting solutions.   Kla was seen today for annual exam.  Diagnoses and all orders for this visit:  Preventative health care -     Comprehensive metabolic panel -     Lipid panel -     TSH  Prediabetes -     Hemoglobin A1c  Encounter for lipid screening for cardiovascular disease -     Lipid panel  Iron deficiency anemia due to chronic blood loss -     CBC with Differential/Platelet -     Iron, TIBC and Ferritin Panel      Problem List Items Addressed This Visit      Other   Iron deficiency anemia due to chronic blood loss    Secondary to menorrhagia Reports she is unable tolerate high doses of ferrous sulfate  Repeat cbc and iron panel Advised to consider referral to hematology if cbc and iron panel continues to be very low.      Relevant Orders   CBC with Differential/Platelet   Iron, TIBC and Ferritin Panel   Prediabetes   Relevant  Orders   Hemoglobin A1c    Other Visit Diagnoses    Preventative health care    -  Primary   Relevant Orders   Comprehensive metabolic panel   Lipid panel   TSH   Encounter for lipid screening for cardiovascular disease       Relevant Orders   Lipid panel      Follow up: Return in about 6 months (around  10/30/2020) for prediabetes and anemia.  Wilfred Lacy, NP

## 2020-05-02 NOTE — Assessment & Plan Note (Signed)
Secondary to menorrhagia Reports she is unable tolerate high doses of ferrous sulfate  Repeat cbc and iron panel Advised to consider referral to hematology if cbc and iron panel continues to be very low.

## 2020-05-03 ENCOUNTER — Encounter: Payer: Commercial Managed Care - PPO | Admitting: Obstetrics and Gynecology

## 2020-05-03 LAB — IRON,TIBC AND FERRITIN PANEL
%SAT: 11 % (calc) — ABNORMAL LOW (ref 16–45)
Ferritin: 9 ng/mL — ABNORMAL LOW (ref 16–232)
Iron: 50 ug/dL (ref 40–190)
TIBC: 459 mcg/dL (calc) — ABNORMAL HIGH (ref 250–450)

## 2020-05-08 ENCOUNTER — Encounter: Payer: Self-pay | Admitting: Nurse Practitioner

## 2020-05-08 ENCOUNTER — Other Ambulatory Visit: Payer: Self-pay

## 2020-05-08 ENCOUNTER — Ambulatory Visit (INDEPENDENT_AMBULATORY_CARE_PROVIDER_SITE_OTHER): Payer: Commercial Managed Care - PPO | Admitting: Nurse Practitioner

## 2020-05-08 ENCOUNTER — Other Ambulatory Visit (HOSPITAL_COMMUNITY)
Admission: RE | Admit: 2020-05-08 | Discharge: 2020-05-08 | Disposition: A | Discharge status: Home / Self Care | Payer: Commercial Managed Care - PPO | Source: Ambulatory Visit | Attending: Nurse Practitioner | Admitting: Nurse Practitioner

## 2020-05-08 VITALS — BP 130/88 | HR 80 | Temp 97.0°F | Ht 65.5 in | Wt 247.2 lb

## 2020-05-08 DIAGNOSIS — A599 Trichomoniasis, unspecified: Secondary | ICD-10-CM

## 2020-05-08 DIAGNOSIS — Z124 Encounter for screening for malignant neoplasm of cervix: Secondary | ICD-10-CM | POA: Diagnosis present

## 2020-05-08 DIAGNOSIS — Z114 Encounter for screening for human immunodeficiency virus [HIV]: Secondary | ICD-10-CM

## 2020-05-08 DIAGNOSIS — E282 Polycystic ovarian syndrome: Secondary | ICD-10-CM

## 2020-05-08 DIAGNOSIS — R7303 Prediabetes: Secondary | ICD-10-CM

## 2020-05-08 DIAGNOSIS — D500 Iron deficiency anemia secondary to blood loss (chronic): Secondary | ICD-10-CM

## 2020-05-08 DIAGNOSIS — Z6841 Body Mass Index (BMI) 40.0 and over, adult: Secondary | ICD-10-CM

## 2020-05-08 NOTE — Assessment & Plan Note (Addendum)
No lifestyle modifications implemented Use of phentermine x76months on 3 different occasions, lost 10-20lbs each time but regained weight with discontinuation of medication. Hx of prediabetes. Wt Readings from Last 3 Encounters:  05/08/20 247 lb 3.2 oz (112.1 kg)  05/02/20 248 lb 12.8 oz (112.9 kg)  05/02/19 257 lb 6.4 oz (116.8 kg)   Advised Ms. Gilchrest about the importance of lifestyle modifications (decrease calories and daily exercise-printed information provided) to promote healthy and sustainable weight loss. I recommended referral to weight loss clinic. She agreed. Order entered.

## 2020-05-08 NOTE — Progress Notes (Addendum)
Subjective:  Patient ID: Shirley Garcia, female    DOB: 1976-04-25  Age: 44 y.o. MRN: [id number]  CC: Follow-up (weight management and repeat PAP)  HPI  Class 3 severe obesity due to excess calories without serious comorbidity with body mass index (BMI) of 40.0 to 44.9 in adult [facility]) No lifestyle modifications implemented Use of phentermine x26months on 3 different occasions, lost 10-20lbs each time but regained weight with discontinuation of medication. Hx of prediabetes. Wt Readings from Last 3 Encounters:  05/08/20 247 lb 3.2 oz (112.1 kg)  05/02/20 248 lb 12.8 oz (112.9 kg)  05/02/19 257 lb 6.4 oz (116.8 kg)   Advised Ms. Jepsen about the importance of lifestyle modifications (decrease calories and daily exercise-printed information provided) to promote healthy and sustainable weight loss. I recommended referral to weight loss clinic. She agreed. Order entered.  Iron deficiency anemia due to chronic blood loss CBC and iron panel indicates: iron deficient anemia. I entered referral to hematology to discuss iron infusion since you are unable to tolerate oral supplement.  PCOS (polycystic ovarian syndrome) hgbA1c at 5.9 BMI at 40.51 Advised to consider use of metformin.  Unable to complete PAP with physical due to menstrual cycle. PAP will be completed today.  Reviewed past Medical, Social and Family history today.  Outpatient Medications Prior to Visit  Medication Sig Dispense Refill  . fluticasone (FLONASE) 50 MCG/ACT nasal spray Place 2 sprays into both nostrils daily. 16 g 0  . sodium chloride (OCEAN) 0.65 % SOLN nasal spray Place 1 spray into both nostrils as needed for congestion. 15 mL 0  . ferrous sulfate 325 (65 FE) MG EC tablet Take 1 tablet (325 mg total) by mouth 3 (three) times daily with meals. (Patient not taking: Reported on 05/02/2020) 90 tablet 3   No facility-administered medications prior to visit.    ROS See HPI  Objective:  BP 130/88 (BP Location:  Left Arm, Patient Position: Sitting, Cuff Size: Normal)   Pulse 80   Temp (!) 97 F (36.1 C) (Temporal)   Ht 5' 5.5" (1.664 m)   Wt 247 lb 3.2 oz (112.1 kg)   LMP 05/02/2020 (Exact Date)   SpO2 97%   BMI 40.51 kg/m   Physical Exam Exam conducted with a chaperone present.  Genitourinary:    Labia:        Right: No rash or tenderness.        Left: No rash or tenderness.      Urethra: Prolapse present.     Vagina: Normal.     Cervix: Normal.     Uterus: Not enlarged and not tender.      Adnexa: Right adnexa normal and left adnexa normal.  Lymphadenopathy:     Lower Body: No right inguinal adenopathy. No left inguinal adenopathy.    Assessment & Plan:  This visit occurred during the SARS-CoV-2 public health emergency.  Safety protocols were in place, including screening questions prior to the visit, additional usage of staff PPE, and extensive cleaning of exam room while observing appropriate contact time as indicated for disinfecting solutions.   Cookie was seen today for follow-up.  Diagnoses and all orders for this visit:  Class 3 severe obesity due to excess calories without serious comorbidity with body mass index (BMI) of 40.0 to 44.9 in adult ([place]) -     Amb Ref to Medical Weight Management  Encounter for Papanicolaou smear for cervical cancer screening -     Cytology - PAP( [place])  Iron deficiency anemia due to chronic blood loss -     Ambulatory referral to Hematology  PCOS (polycystic ovarian syndrome)  Trichimoniasis -     metroNIDAZOLE (FLAGYL) 500 MG tablet; Take 4 tablets (2,000 mg total) by mouth once for 1 dose. -     Urine cytology ancillary only([place]); Future  Encounter for screening for HIV -     HIV antibody (with reflex); Future    Problem List Items Addressed This Visit      Endocrine   PCOS (polycystic ovarian syndrome)    hgbA1c at 5.9 BMI at 40.51 Advised to consider use of metformin.        Other   Class 3 severe  obesity due to excess calories without serious comorbidity with body mass index (BMI) of 40.0 to 44.9 in adult [facility]) - Primary    No lifestyle modifications implemented Use of phentermine x95months on 3 different occasions, lost 10-20lbs each time but regained weight with discontinuation of medication. Hx of prediabetes. Wt Readings from Last 3 Encounters:  05/08/20 247 lb 3.2 oz (112.1 kg)  05/02/20 248 lb 12.8 oz (112.9 kg)  05/02/19 257 lb 6.4 oz (116.8 kg)   Advised Ms. Thurman about the importance of lifestyle modifications (decrease calories and daily exercise-printed information provided) to promote healthy and sustainable weight loss. I recommended referral to weight loss clinic. She agreed. Order entered.      Relevant Orders   Amb Ref to Medical Weight Management   Iron deficiency anemia due to chronic blood loss    CBC and iron panel indicates: iron deficient anemia. I entered referral to hematology to discuss iron infusion since you are unable to tolerate oral supplement.      Relevant Orders   Ambulatory referral to Hematology    Other Visit Diagnoses    Encounter for Papanicolaou smear for cervical cancer screening       Relevant Orders   Cytology - PAP( [place]) (Completed)   Trichimoniasis       Relevant Medications   metroNIDAZOLE (FLAGYL) 500 MG tablet   Other Relevant Orders   Urine cytology ancillary only([place])   Encounter for screening for HIV       Relevant Orders   HIV antibody (with reflex)      Follow-up: Return if symptoms worsen or fail to improve.  Wilfred Lacy, NP

## 2020-05-08 NOTE — Assessment & Plan Note (Signed)
hgbA1c at 5.9 BMI at 40.51 Advised to consider use of metformin.

## 2020-05-08 NOTE — Assessment & Plan Note (Signed)
CBC and iron panel indicates: iron deficient anemia. I entered referral to hematology to discuss iron infusion since you are unable to tolerate oral supplement.

## 2020-05-08 NOTE — Patient Instructions (Addendum)
You will be contacted to schedule appt with weight loss clinic.  Exercising to Lose Weight Exercise is structured, repetitive physical activity to improve fitness and health. Getting regular exercise is important for everyone. It is especially important if you are overweight. Being overweight increases your risk of heart disease, stroke, diabetes, high blood pressure, and several types of cancer. Reducing your calorie intake and exercising can help you lose weight. Exercise is usually categorized as moderate or vigorous intensity. To lose weight, most people need to do a certain amount of moderate-intensity or vigorous-intensity exercise each week. Moderate-intensity exercise  Moderate-intensity exercise is any activity that gets you moving enough to burn at least three times more energy (calories) than if you were sitting. Examples of moderate exercise include:  Walking a mile in 15 minutes.  Doing light yard work.  Biking at an easy pace. Most people should get at least 150 minutes (2 hours and 30 minutes) a week of moderate-intensity exercise to maintain their body weight. Vigorous-intensity exercise Vigorous-intensity exercise is any activity that gets you moving enough to burn at least six times more calories than if you were sitting. When you exercise at this intensity, you should be working hard enough that you are not able to carry on a conversation. Examples of vigorous exercise include:  Running.  Playing a team sport, such as football, basketball, and soccer.  Jumping rope. Most people should get at least 75 minutes (1 hour and 15 minutes) a week of vigorous-intensity exercise to maintain their body weight. How can exercise affect me? When you exercise enough to burn more calories than you eat, you lose weight. Exercise also reduces body fat and builds muscle. The more muscle you have, the more calories you burn. Exercise also:  Improves mood.  Reduces stress and  tension.  Improves your overall fitness, flexibility, and endurance.  Increases bone strength. The amount of exercise you need to lose weight depends on:  Your age.  The type of exercise.  Any health conditions you have.  Your overall physical ability. Talk to your health care provider about how much exercise you need and what types of activities are safe for you. What actions can I take to lose weight? Nutrition   Make changes to your diet as told by your health care provider or diet and nutrition specialist (dietitian). This may include: ? Eating fewer calories. ? Eating more protein. ? Eating less unhealthy fats. ? Eating a diet that includes fresh fruits and vegetables, whole grains, low-fat dairy products, and lean protein. ? Avoiding foods with added fat, salt, and sugar.  Drink plenty of water while you exercise to prevent dehydration or heat stroke. Activity  Choose an activity that you enjoy and set realistic goals. Your health care provider can help you make an exercise plan that works for you.  Exercise at a moderate or vigorous intensity most days of the week. ? The intensity of exercise may vary from person to person. You can tell how intense a workout is for you by paying attention to your breathing and heartbeat. Most people will notice their breathing and heartbeat get faster with more intense exercise.  Do resistance training twice each week, such as: ? Push-ups. ? Sit-ups. ? Lifting weights. ? Using resistance bands.  Getting short amounts of exercise can be just as helpful as long structured periods of exercise. If you have trouble finding time to exercise, try to include exercise in your daily routine. ? Get up, stretch, and  walk around every 30 minutes throughout the day. ? Go for a walk during your lunch break. ? Park your car farther away from your destination. ? If you take public transportation, get off one stop early and walk the rest of the  way. ? Make phone calls while standing up and walking around. ? Take the stairs instead of elevators or escalators.  Wear comfortable clothes and shoes with good support.  Do not exercise so much that you hurt yourself, feel dizzy, or get very short of breath. Where to find more information  U.S. Department of Health and Human Services: [employer]at  Centers for Disease Control and Prevention (CDC): http://www.wolf.info/ Contact a health care provider:  Before starting a new exercise program.  If you have questions or concerns about your weight.  If you have a medical problem that keeps you from exercising. Get help right away if you have any of the following while exercising:  Injury.  Dizziness.  Difficulty breathing or shortness of breath that does not go away when you stop exercising.  Chest pain.  Rapid heartbeat. Summary  Being overweight increases your risk of heart disease, stroke, diabetes, high blood pressure, and several types of cancer.  Losing weight happens when you burn more calories than you eat.  Reducing the amount of calories you eat in addition to getting regular moderate or vigorous exercise each week helps you lose weight. This information is not intended to replace advice given to you by your health care provider. Make sure you discuss any questions you have with your health care provider. Document Revised: 08/31/2017 Document Reviewed: 08/31/2017 Elsevier Patient Education  2020 [place] for [employer] Calories are units of energy. Your body needs a certain amount of calories from food to keep you going throughout the day. When you eat more calories than your body needs, your body stores the extra calories as fat. When you eat fewer calories than your body needs, your body burns fat to get the energy it needs. Calorie counting means keeping track of how many calories you eat and drink each day. Calorie counting can be helpful if you  need to lose weight. If you make sure to eat fewer calories than your body needs, you should lose weight. Ask your health care provider what a healthy weight is for you. For calorie counting to work, you will need to eat the right number of calories in a day in order to lose a healthy amount of weight per week. A dietitian can help you determine how many calories you need in a day and will give you suggestions on how to reach your calorie goal.  A healthy amount of weight to lose per week is usually 1-2 lb (0.5-0.9 kg). This usually means that your daily calorie intake should be reduced by 500-750 calories.  Eating 1,200 - 1,500 calories per day can help most women lose weight.  Eating 1,500 - 1,800 calories per day can help most men lose weight. What is my plan? My goal is to have __________ calories per day. If I have this many calories per day, I should lose around __________ pounds per week. What do I need to know about calorie counting? In order to meet your daily calorie goal, you will need to:  Find out how many calories are in each food you would like to eat. Try to do this before you eat.  Decide how much of the food you plan to eat.  Write down what you ate and how many calories it had. Doing this is called keeping a food log. To successfully lose weight, it is important to balance calorie counting with a healthy lifestyle that includes regular activity. Aim for 150 minutes of moderate exercise (such as walking) or 75 minutes of vigorous exercise (such as running) each week. Where do I find calorie information?  The number of calories in a food can be found on a Nutrition Facts label. If a food does not have a Nutrition Facts label, try to look up the calories online or ask your dietitian for help. Remember that calories are listed per serving. If you choose to have more than one serving of a food, you will have to multiply the calories per serving by the amount of servings you plan  to eat. For example, the label on a package of bread might say that a serving size is 1 slice and that there are 90 calories in a serving. If you eat 1 slice, you will have eaten 90 calories. If you eat 2 slices, you will have eaten 180 calories. How do I keep a food log? Immediately after each meal, record the following information in your food log:  What you ate. Don't forget to include toppings, sauces, and other extras on the food.  How much you ate. This can be measured in cups, ounces, or number of items.  How many calories each food and drink had.  The total number of calories in the meal. Keep your food log near you, such as in a small notebook in your pocket, or use a mobile app or website. Some programs will calculate calories for you and show you how many calories you have left for the day to meet your goal. What are some calorie counting tips?   Use your calories on foods and drinks that will fill you up and not leave you hungry: ? Some examples of foods that fill you up are nuts and nut butters, vegetables, lean proteins, and high-fiber foods like whole grains. High-fiber foods are foods with more than 5 g fiber per serving. ? Drinks such as sodas, specialty coffee drinks, alcohol, and juices have a lot of calories, yet do not fill you up.  Eat nutritious foods and avoid empty calories. Empty calories are calories you get from foods or beverages that do not have many vitamins or protein, such as candy, sweets, and soda. It is better to have a nutritious high-calorie food (such as an avocado) than a food with few nutrients (such as a bag of chips).  Know how many calories are in the foods you eat most often. This will help you calculate calorie counts faster.  Pay attention to calories in drinks. Low-calorie drinks include water and unsweetened drinks.  Pay attention to nutrition labels for "low fat" or "fat free" foods. These foods sometimes have the same amount of calories or  more calories than the full fat versions. They also often have added sugar, starch, or salt, to make up for flavor that was removed with the fat.  Find a way of tracking calories that works for you. Get creative. Try different apps or programs if writing down calories does not work for you. What are some portion control tips?  Know how many calories are in a serving. This will help you know how many servings of a certain food you can have.  Use a measuring cup to measure serving sizes. You could also try  weighing out portions on a kitchen scale. With time, you will be able to estimate serving sizes for some foods.  Take some time to put servings of different foods on your favorite plates, bowls, and cups so you know what a serving looks like.  Try not to eat straight from a bag or box. Doing this can lead to overeating. Put the amount you would like to eat in a cup or on a plate to make sure you are eating the right portion.  Use smaller plates, glasses, and bowls to prevent overeating.  Try not to multitask (for example, watch TV or use your computer) while eating. If it is time to eat, sit down at a table and enjoy your food. This will help you to know when you are full. It will also help you to be aware of what you are eating and how much you are eating. What are tips for following this plan? Reading food labels  Check the calorie count compared to the serving size. The serving size may be smaller than what you are used to eating.  Check the source of the calories. Make sure the food you are eating is high in vitamins and protein and low in saturated and trans fats. Shopping  Read nutrition labels while you shop. This will help you make healthy decisions before you decide to purchase your food.  Make a grocery list and stick to it. Cooking  Try to cook your favorite foods in a healthier way. For example, try baking instead of frying.  Use low-fat dairy products. Meal  planning  Use more fruits and vegetables. Half of your plate should be fruits and vegetables.  Include lean proteins like poultry and fish. How do I count calories when eating out?  Ask for smaller portion sizes.  Consider sharing an entree and sides instead of getting your own entree.  If you get your own entree, eat only half. Ask for a box at the beginning of your meal and put the rest of your entree in it so you are not tempted to eat it.  If calories are listed on the menu, choose the lower calorie options.  Choose dishes that include vegetables, fruits, whole grains, low-fat dairy products, and lean protein.  Choose items that are boiled, broiled, grilled, or steamed. Stay away from items that are buttered, battered, fried, or served with cream sauce. Items labeled "crispy" are usually fried, unless stated otherwise.  Choose water, low-fat milk, unsweetened iced tea, or other drinks without added sugar. If you want an alcoholic beverage, choose a lower calorie option such as a glass of wine or light beer.  Ask for dressings, sauces, and syrups on the side. These are usually high in calories, so you should limit the amount you eat.  If you want a salad, choose a garden salad and ask for grilled meats. Avoid extra toppings like bacon, cheese, or fried items. Ask for the dressing on the side, or ask for olive oil and vinegar or lemon to use as dressing.  Estimate how many servings of a food you are given. For example, a serving of cooked rice is  cup or about the size of half a baseball. Knowing serving sizes will help you be aware of how much food you are eating at restaurants. The list below tells you how big or small some common portion sizes are based on everyday objects: ? 1 oz--4 stacked dice. ? 3 oz--1 deck of cards. ? 1  tsp--1 die. ? 1 Tbsp-- a ping-pong ball. ? 2 Tbsp--1 ping-pong ball. ?  cup-- baseball. ? 1 cup--1 baseball. Summary  Calorie counting means keeping  track of how many calories you eat and drink each day. If you eat fewer calories than your body needs, you should lose weight.  A healthy amount of weight to lose per week is usually 1-2 lb (0.5-0.9 kg). This usually means reducing your daily calorie intake by 500-750 calories.  The number of calories in a food can be found on a Nutrition Facts label. If a food does not have a Nutrition Facts label, try to look up the calories online or ask your dietitian for help.  Use your calories on foods and drinks that will fill you up, and not on foods and drinks that will leave you hungry.  Use smaller plates, glasses, and bowls to prevent overeating. This information is not intended to replace advice given to you by your health care provider. Make sure you discuss any questions you have with your health care provider. Document Revised: 05/07/2018 Document Reviewed: 07/18/2016 Elsevier Patient Education  [place].

## 2020-05-09 ENCOUNTER — Encounter (INDEPENDENT_AMBULATORY_CARE_PROVIDER_SITE_OTHER): Payer: Self-pay

## 2020-05-09 ENCOUNTER — Encounter: Payer: Self-pay | Admitting: Nurse Practitioner

## 2020-05-09 LAB — CYTOLOGY - PAP
Adequacy: ABSENT
Comment: NEGATIVE
Diagnosis: NEGATIVE
High risk HPV: NEGATIVE

## 2020-05-09 MED ORDER — METFORMIN HCL ER 500 MG PO TB24: 500.0000 mg | ORAL_TABLET | Freq: Every day | ORAL | 1 refills | Status: AC

## 2020-05-09 MED ORDER — METRONIDAZOLE 500 MG PO TABS: 2000.0000 mg | ORAL_TABLET | Freq: Once | ORAL | 0 refills | Status: AC

## 2020-05-09 NOTE — Addendum Note (Signed)
Addended by: Leana Gamer on: 05/09/2020 02:48 PM   Modules accepted: Orders, Level of Service

## 2020-05-10 ENCOUNTER — Encounter: Payer: Self-pay | Admitting: Obstetrics and Gynecology

## 2020-06-01 ENCOUNTER — Other Ambulatory Visit: Payer: Self-pay | Admitting: Nurse Practitioner

## 2020-06-01 DIAGNOSIS — Z1231 Encounter for screening mammogram for malignant neoplasm of breast: Secondary | ICD-10-CM

## 2020-06-27 ENCOUNTER — Ambulatory Visit
Admission: RE | Admit: 2020-06-27 | Discharge: 2020-06-27 | Disposition: A | Discharge status: Home / Self Care | Payer: Commercial Managed Care - PPO | Source: Ambulatory Visit | Attending: Nurse Practitioner | Admitting: Nurse Practitioner

## 2020-06-27 ENCOUNTER — Other Ambulatory Visit: Payer: Self-pay

## 2020-06-27 DIAGNOSIS — Z1231 Encounter for screening mammogram for malignant neoplasm of breast: Secondary | ICD-10-CM

## 2020-09-21 ENCOUNTER — Encounter (INDEPENDENT_AMBULATORY_CARE_PROVIDER_SITE_OTHER): Payer: Self-pay

## 2020-10-31 ENCOUNTER — Ambulatory Visit: Payer: Commercial Managed Care - PPO | Admitting: Nurse Practitioner

## 2021-01-12 ENCOUNTER — Emergency Department
Admission: EM | Admit: 2021-01-12 | Discharge: 2021-01-12 | Disposition: A | Discharge status: Home / Self Care | Payer: Commercial Managed Care - PPO | Attending: Emergency Medicine | Admitting: Emergency Medicine

## 2021-01-12 ENCOUNTER — Emergency Department: Payer: Commercial Managed Care - PPO

## 2021-01-12 ENCOUNTER — Encounter: Payer: Self-pay | Admitting: Intensive Care

## 2021-01-12 ENCOUNTER — Other Ambulatory Visit: Payer: Self-pay

## 2021-01-12 DIAGNOSIS — Z79899 Other long term (current) drug therapy: Secondary | ICD-10-CM | POA: Insufficient documentation

## 2021-01-12 DIAGNOSIS — Y92410 Unspecified street and highway as the place of occurrence of the external cause: Secondary | ICD-10-CM | POA: Diagnosis not present

## 2021-01-12 DIAGNOSIS — M25512 Pain in left shoulder: Secondary | ICD-10-CM | POA: Diagnosis not present

## 2021-01-12 DIAGNOSIS — S0990XA Unspecified injury of head, initial encounter: Secondary | ICD-10-CM | POA: Diagnosis present

## 2021-01-12 DIAGNOSIS — Z7984 Long term (current) use of oral hypoglycemic drugs: Secondary | ICD-10-CM | POA: Insufficient documentation

## 2021-01-12 DIAGNOSIS — S139XXA Sprain of joints and ligaments of unspecified parts of neck, initial encounter: Secondary | ICD-10-CM | POA: Diagnosis not present

## 2021-01-12 DIAGNOSIS — S0012XA Contusion of left eyelid and periocular area, initial encounter: Secondary | ICD-10-CM | POA: Insufficient documentation

## 2021-01-12 DIAGNOSIS — S161XXA Strain of muscle, fascia and tendon at neck level, initial encounter: Secondary | ICD-10-CM

## 2021-01-12 DIAGNOSIS — S0083XA Contusion of other part of head, initial encounter: Secondary | ICD-10-CM

## 2021-01-12 MED ORDER — METHOCARBAMOL 500 MG PO TABS: 500.0000 mg | ORAL_TABLET | Freq: Four times a day (QID) | ORAL | 0 refills | Status: AC | PRN

## 2021-01-12 MED ORDER — HYDROCODONE-ACETAMINOPHEN 5-325 MG PO TABS: 1.0000 | ORAL_TABLET | Freq: Four times a day (QID) | ORAL | 0 refills | Status: AC | PRN

## 2021-01-12 MED ORDER — NAPROXEN 500 MG PO TABS: 500.0000 mg | ORAL_TABLET | Freq: Two times a day (BID) | ORAL | 0 refills | Status: AC

## 2021-01-12 NOTE — Discharge Instructions (Addendum)
Follow-up with your primary care provider if any continued problems.  Take medication only as directed.  The methocarbamol and hydrocodone could cause drowsiness.  Do not drive or operate machinery while taking these medications as it could increase your risk for injury.  Also the naproxen is twice a day for inflammation and pain.  Do not take this on a empty stomach as it could cause GI upset.  You may use ice or heat to your muscles as needed for discomfort.  Apply ice to your face to help reduce swelling.  Even with medication you may be sore and stiff for approximately 4 to 5 days.
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
## 2021-01-21 ENCOUNTER — Ambulatory Visit
Admission: RE | Admit: 2021-01-21 | Discharge: 2021-01-21 | Disposition: A | Discharge status: Home / Self Care | Payer: Commercial Managed Care - PPO | Source: Ambulatory Visit | Attending: Chiropractor | Admitting: Chiropractor

## 2021-01-21 ENCOUNTER — Other Ambulatory Visit: Payer: Self-pay

## 2021-01-21 ENCOUNTER — Other Ambulatory Visit: Payer: Self-pay | Admitting: Chiropractor

## 2021-01-21 ENCOUNTER — Ambulatory Visit
Admission: RE | Admit: 2021-01-21 | Discharge: 2021-01-21 | Disposition: A | Discharge status: Home / Self Care | Payer: Commercial Managed Care - PPO | Attending: Hematology and Oncology | Admitting: Hematology and Oncology

## 2021-01-21 DIAGNOSIS — R2232 Localized swelling, mass and lump, left upper limb: Secondary | ICD-10-CM

## 2021-01-21 DIAGNOSIS — S6992XA Unspecified injury of left wrist, hand and finger(s), initial encounter: Secondary | ICD-10-CM | POA: Insufficient documentation

## 2021-05-07 ENCOUNTER — Encounter: Payer: Commercial Managed Care - PPO | Admitting: Nurse Practitioner

## 2021-05-15 ENCOUNTER — Other Ambulatory Visit: Payer: Self-pay

## 2021-05-17 ENCOUNTER — Other Ambulatory Visit: Payer: Self-pay | Admitting: Family Medicine

## 2021-05-17 DIAGNOSIS — Z1231 Encounter for screening mammogram for malignant neoplasm of breast: Secondary | ICD-10-CM

## 2021-05-30 ENCOUNTER — Encounter: Payer: Commercial Managed Care - PPO | Admitting: Obstetrics and Gynecology

## 2021-07-01 ENCOUNTER — Ambulatory Visit
Admission: RE | Admit: 2021-07-01 | Discharge: 2021-07-01 | Disposition: A | Discharge status: Home / Self Care | Payer: Commercial Managed Care - PPO | Source: Ambulatory Visit | Attending: Family Medicine | Admitting: Family Medicine

## 2021-07-01 ENCOUNTER — Other Ambulatory Visit: Payer: Self-pay

## 2021-07-01 DIAGNOSIS — Z1231 Encounter for screening mammogram for malignant neoplasm of breast: Secondary | ICD-10-CM | POA: Insufficient documentation

## 2021-08-07 ENCOUNTER — Encounter: Payer: Commercial Managed Care - PPO | Admitting: Obstetrics and Gynecology

## 2022-01-19 ENCOUNTER — Emergency Department
Admission: EM | Admit: 2022-01-19 | Discharge: 2022-01-19 | Disposition: A | Discharge status: Home / Self Care | Payer: Commercial Managed Care - PPO | Attending: Emergency Medicine | Admitting: Emergency Medicine

## 2022-01-19 ENCOUNTER — Encounter: Payer: Self-pay | Admitting: Emergency Medicine

## 2022-01-19 DIAGNOSIS — W268XXA Contact with other sharp object(s), not elsewhere classified, initial encounter: Secondary | ICD-10-CM | POA: Diagnosis not present

## 2022-01-19 DIAGNOSIS — S41111A Laceration without foreign body of right upper arm, initial encounter: Secondary | ICD-10-CM | POA: Diagnosis not present

## 2022-01-19 DIAGNOSIS — Z23 Encounter for immunization: Secondary | ICD-10-CM | POA: Diagnosis not present

## 2022-01-19 DIAGNOSIS — S4991XA Unspecified injury of right shoulder and upper arm, initial encounter: Secondary | ICD-10-CM | POA: Diagnosis present

## 2022-01-19 MED ORDER — TETANUS-DIPHTH-ACELL PERTUSSIS 5-2.5-18.5 LF-MCG/0.5 IM SUSY
0.5000 mL | PREFILLED_SYRINGE | Freq: Once | INTRAMUSCULAR | Status: AC
  Administered 2022-01-19: 0.5 mL via INTRAMUSCULAR
  Filled 2022-01-19: qty 0.5

## 2022-01-19 NOTE — ED Notes (Signed)
Pt with approx 1in laceration to posterior R arm above the elbow, adipose tissue noted on exam. No bleeding noted at this time. Pt states cut her arm on a dryer. Pt states unknown last tetanus.

## 2022-01-19 NOTE — ED Provider Notes (Signed)
[facility] Provider Note    Event Date/Time   First MD Initiated Contact with Patient 01/19/22 0147     (approximate)   History   Extremity Laceration   HPI  Shirley Garcia is a 46 y.o. female who presents to the ED from home with a chief complaint of arm laceration.  Patient was behind her dryer trying to figure out why the healing element was not working when she cut her arm on a part.  She is left-hand dominant.  Tetanus is not up-to-date.  Presents with nonbleeding laceration.     Past Medical History   Past Medical History:  Diagnosis Date   Depression    H/O cervical polypectomy 2008   History of PCOS    Ovarian cyst    PCOS (polycystic ovarian syndrome)      Active Problem List   Patient Active Problem List   Diagnosis Date Noted   Iron deficiency anemia due to chronic blood loss 05/02/2020   Prediabetes 05/02/2020   Well woman exam without gynecological exam 05/02/2019   Class 3 severe obesity due to excess calories without serious comorbidity with body mass index (BMI) of 40.0 to 44.9 in adult ([place]) 04/30/2019   PCOS (polycystic ovarian syndrome) 04/27/2018     Past Surgical History   Past Surgical History:  Procedure Laterality Date   CERVICAL CERCLAGE     cervix polpys     CESAREAN SECTION     x1   CYSTECTOMY Bilateral 1996   LAPAROSCOPIC LYSIS OF ADHESIONS  09/17/2015   Procedure: LAPAROSCOPIC LYSIS OF ADHESIONS;  Surgeon: Rubie Maid, MD;  Location: [facility] ORS;  Service: Gynecology;;   LAPAROSCOPIC SALPINGO OOPHERECTOMY Right 09/17/2015   Procedure: LAPAROSCOPIC SALPINGO OOPHORECTOMY;  Surgeon: Rubie Maid, MD;  Location: [facility] ORS;  Service: Gynecology;  Laterality: Right;     Home Medications   Prior to Admission medications   Medication Sig Start Date End Date Taking? Authorizing Provider  ferrous sulfate 325 (65 FE) MG EC tablet Take 1 tablet (325 mg total) by mouth 3 (three) times daily with meals. Patient not  taking: Reported on 05/02/2020 09/19/19   Lucille Passy, MD  fluticasone [facility]) 50 MCG/ACT nasal spray Place 2 sprays into both nostrils daily. 08/31/18   Nche, Charlene Brooke, NP  metFORMIN (GLUCOPHAGE-XR) 500 MG 24 hr tablet Take 1 tablet (500 mg total) by mouth daily with breakfast. 05/09/20   Nche, Charlene Brooke, NP  methocarbamol (ROBAXIN) 500 MG tablet Take 1 tablet (500 mg total) by mouth every 6 (six) hours as needed. 01/12/21   Johnn Hai, PA-C  naproxen (NAPROSYN) 500 MG tablet Take 1 tablet (500 mg total) by mouth 2 (two) times daily with a meal. 01/12/21   Letitia Neri L, PA-C  sodium chloride (OCEAN) 0.65 % SOLN nasal spray Place 1 spray into both nostrils as needed for congestion. 08/31/18   Nche, Charlene Brooke, NP     Allergies  Patient has no known allergies.   Family History   Family History  Problem Relation Age of Onset   Hypertension Mother    Hypertension Sister    Diabetes Maternal Grandfather    Breast cancer Neg Hx      Physical Exam  Triage Vital Signs: ED Triage Vitals  Enc Vitals Group     BP 01/19/22 0011 (!) 140/96     Pulse Rate 01/19/22 0011 78     Resp 01/19/22 0011 18     Temp 01/19/22  0011 99 F (37.2 C)     Temp src --      SpO2 01/19/22 0011 100 %     Weight 01/19/22 0011 250 lb (113.4 kg)     Height 01/19/22 0011 '5\' 5"'$  (1.651 m)     Head Circumference --      Peak Flow --      Pain Score 01/19/22 0011 7     Pain Loc --      Pain Edu? --      Excl. in [place]? --     Updated Vital Signs: BP 131/85 (BP Location: Left Arm)   Pulse 63   Temp 99 F (37.2 C)   Resp 18   Ht '5\' 5"'$  (1.651 m)   Wt 113.4 kg   LMP 12/30/2021 (Approximate)   SpO2 100%   BMI 41.60 kg/m    General: Awake, no distress.  CV:  Good peripheral perfusion.  Resp:  Normal effort.  Abd:  No distention.  Other:  Right posterior arm with approximately 1 cm linear laceration without active bleeding.  2+ radial pulse.  Brisk, less than 5-second cap  refill.   ED Results / Procedures / Treatments  Labs (all labs ordered are listed, but only abnormal results are displayed) Labs Reviewed - No data to display   EKG  None   RADIOLOGY None   Official radiology report(s): No results found.   PROCEDURES:  Critical Care performed: No  ..Laceration Repair  Date/Time: 01/19/2022 2:18 AM Performed by: Paulette Blanch, MD Authorized by: Paulette Blanch, MD   Consent:    Consent obtained:  Verbal   Consent given by:  Patient   Risks discussed:  Infection, pain, poor cosmetic result and poor wound healing Anesthesia:    Anesthesia method:  None Laceration details:    Location:  Shoulder/arm   Shoulder/arm location:  R upper arm   Length (cm):  1   Depth (mm):  2 Pre-procedure details:    Preparation:  Patient was prepped and draped in usual sterile fashion Exploration:    Hemostasis achieved with:  Direct pressure   Contaminated: no   Treatment:    Area cleansed with:  Saline and povidone-iodine   Amount of cleaning:  Standard   Irrigation solution:  Sterile saline   Visualized foreign bodies/material removed: no   Skin repair:    Repair method: Dermaclip x 2. Approximation:    Approximation:  Close Repair type:    Repair type:  Simple Post-procedure details:    Procedure completion:  Tolerated well, no immediate complications   MEDICATIONS ORDERED IN ED: Medications  Tdap (BOOSTRIX) injection 0.5 mL (0.5 mLs Intramuscular Given 01/19/22 0207)     IMPRESSION / MDM / ASSESSMENT AND PLAN / ED COURSE  I reviewed the triage vital signs and the nursing notes.                             46 year old female presenting with right arm laceration.  Area cleansed and repaired with derma clips x2.  Tetanus updated.  Patient tolerated procedure well.  Strict return precautions given.  Patient verbalizes understanding and agrees with plan of care.   FINAL CLINICAL IMPRESSION(S) / ED DIAGNOSES   Final diagnoses:   Laceration of right upper extremity, initial encounter     Rx / DC Orders   ED Discharge Orders     None        Note:  This document was prepared using Dragon voice recognition software and may include unintentional dictation errors.   Paulette Blanch, MD 01/19/22 [id number]

## 2022-01-19 NOTE — Discharge Instructions (Signed)
Keep wound clean and dry.  You may remove Dermabond in 7 to 10 days time.  Your tetanus has been updated and will be good for 10 years.  Return to the ER for worsening symptoms, increased redness/swelling, purulent discharge or other concerns.

## 2022-01-19 NOTE — ED Triage Notes (Signed)
Pt presents via POV with complaints of right elbow laceration ~1-2 inch in length from a dryer. Bleeding controlled with gauze and coban.

## 2022-04-09 ENCOUNTER — Encounter (INDEPENDENT_AMBULATORY_CARE_PROVIDER_SITE_OTHER): Payer: Self-pay

## 2022-04-29 ENCOUNTER — Other Ambulatory Visit: Payer: Self-pay | Admitting: Family Medicine

## 2022-04-29 DIAGNOSIS — Z1231 Encounter for screening mammogram for malignant neoplasm of breast: Secondary | ICD-10-CM

## 2022-07-03 ENCOUNTER — Ambulatory Visit
Admission: RE | Admit: 2022-07-03 | Discharge: 2022-07-03 | Disposition: A | Discharge status: Home / Self Care | Payer: Commercial Managed Care - PPO | Source: Ambulatory Visit | Attending: Family Medicine | Admitting: Family Medicine

## 2022-07-03 DIAGNOSIS — Z1231 Encounter for screening mammogram for malignant neoplasm of breast: Secondary | ICD-10-CM | POA: Diagnosis present

## 2023-03-20 IMAGING — MG MM DIGITAL SCREENING BILAT W/ TOMO AND CAD
8 series · 8 of 24 positions shown · non-contrast
Comparison: Previous exam(s).

CLINICAL DATA: Screening.

EXAM:
DIGITAL SCREENING BILATERAL MAMMOGRAM WITH TOMOSYNTHESIS AND CAD
TECHNIQUE: Bilateral screening digital craniocaudal and mediolateral oblique
mammograms were obtained. Bilateral screening digital breast
tomosynthesis was performed. The images were evaluated with
computer-aided detection.

[R MLO synth-2D]
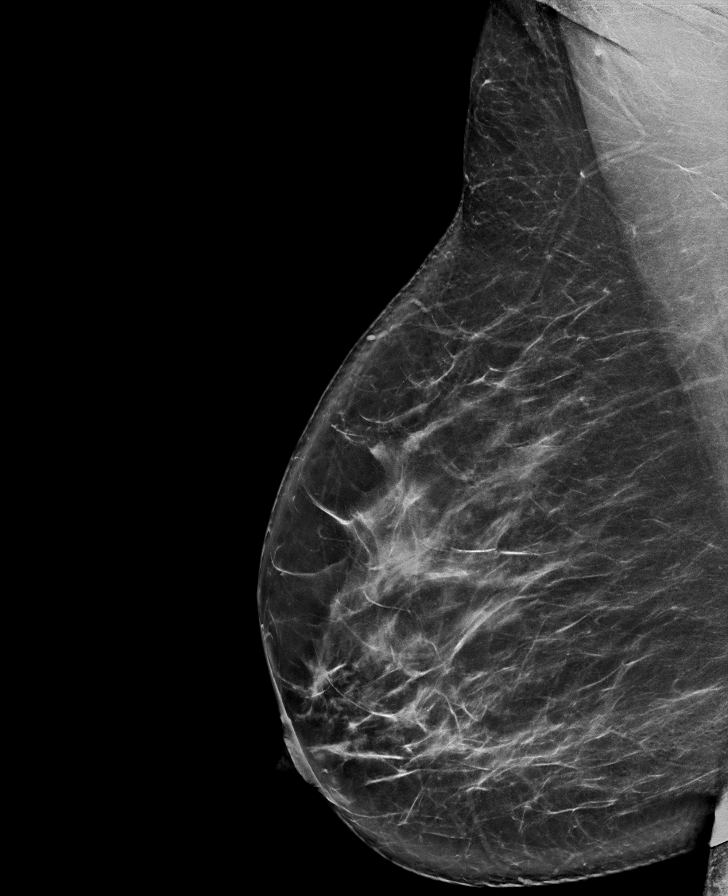

[R CC synth-2D]
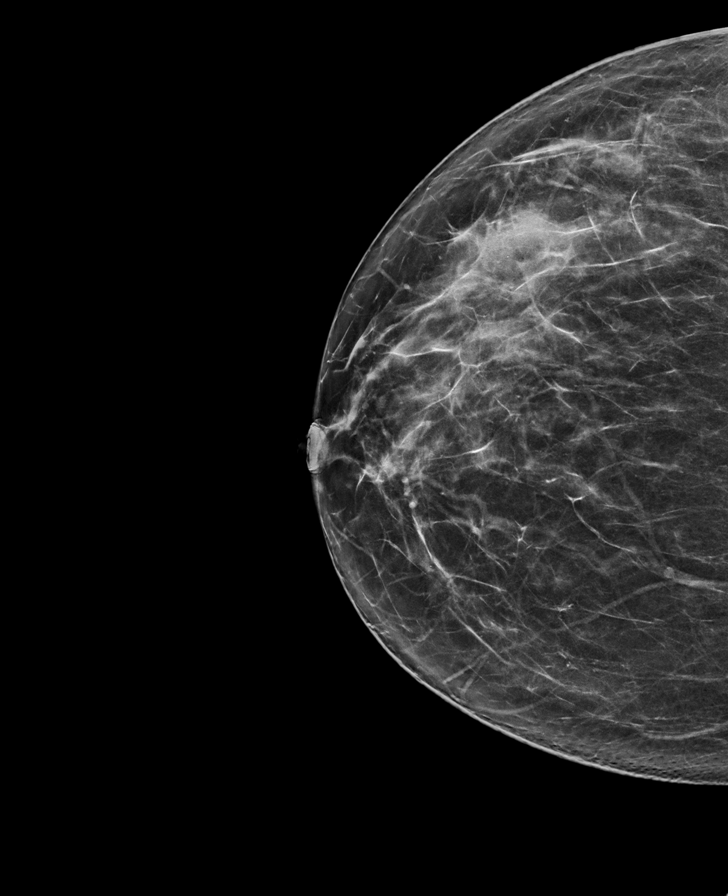

[L CC synth-2D]
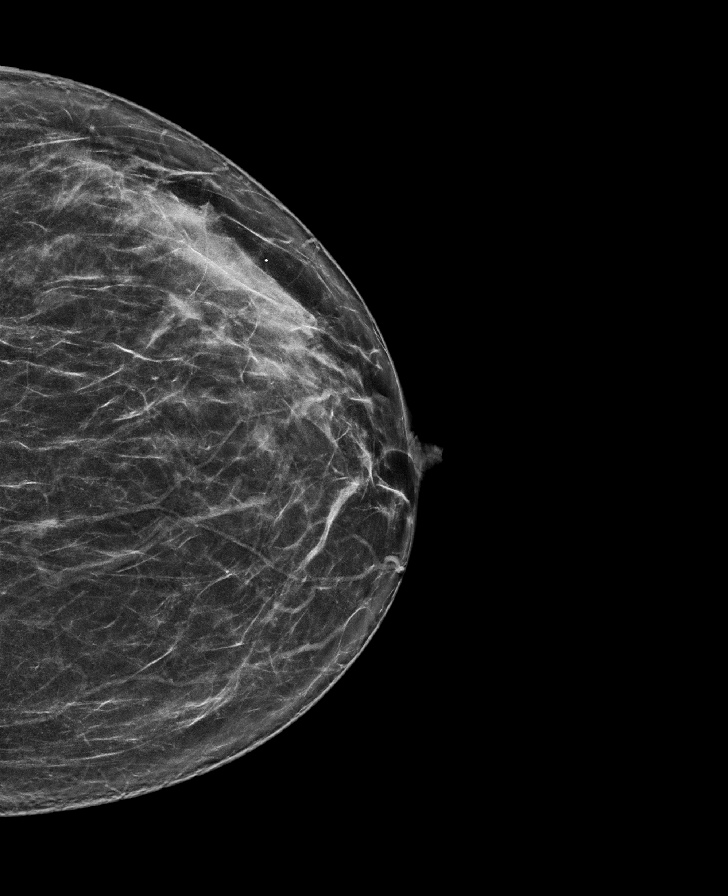

[L MLO synth-2D]
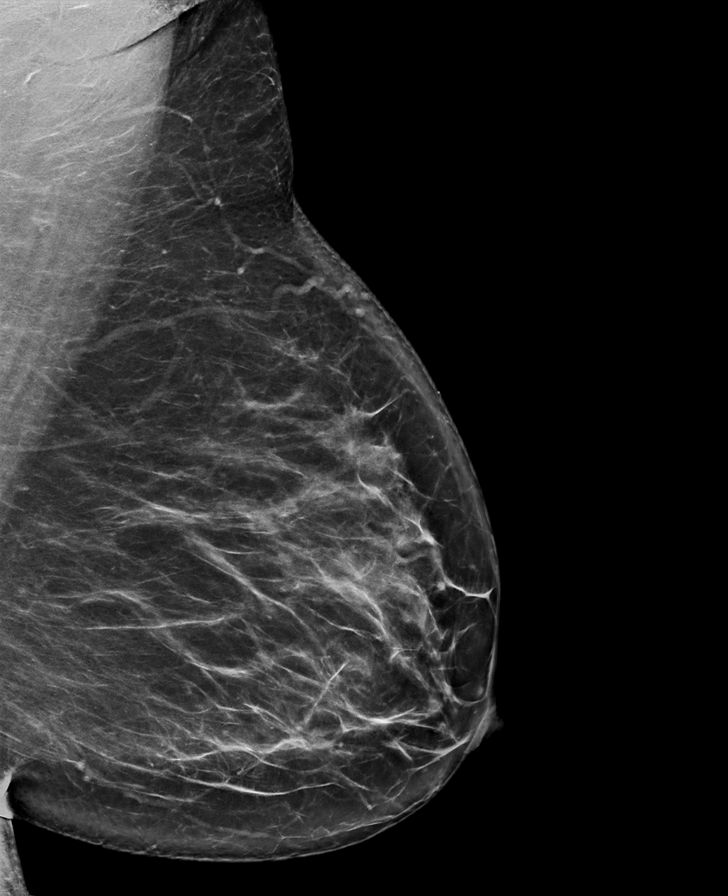

[L MLO tomo · tomo slice 45/90.0]
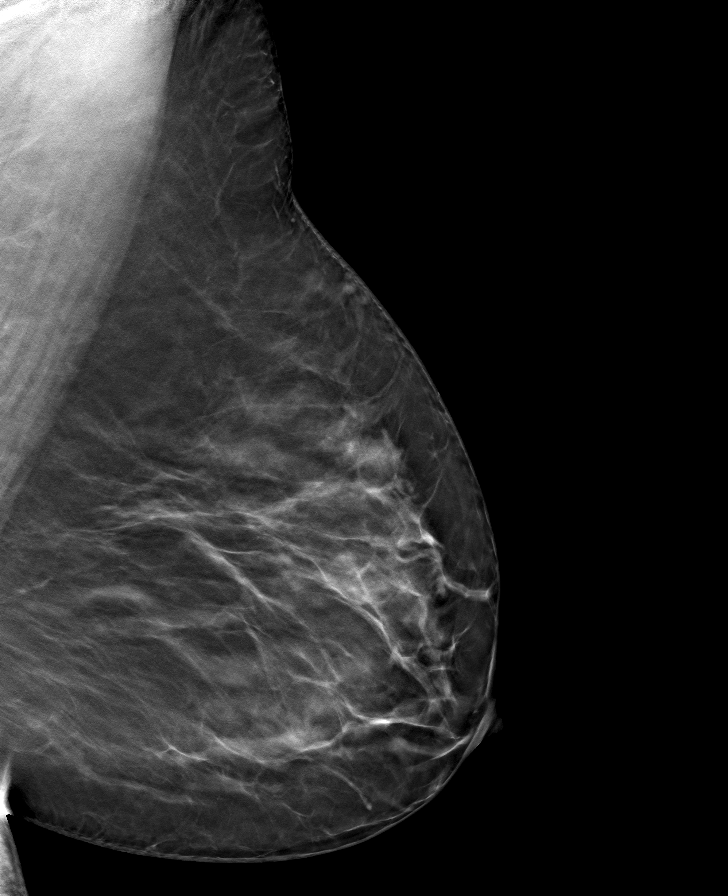

[R CC tomo · tomo slice 39/78.0]
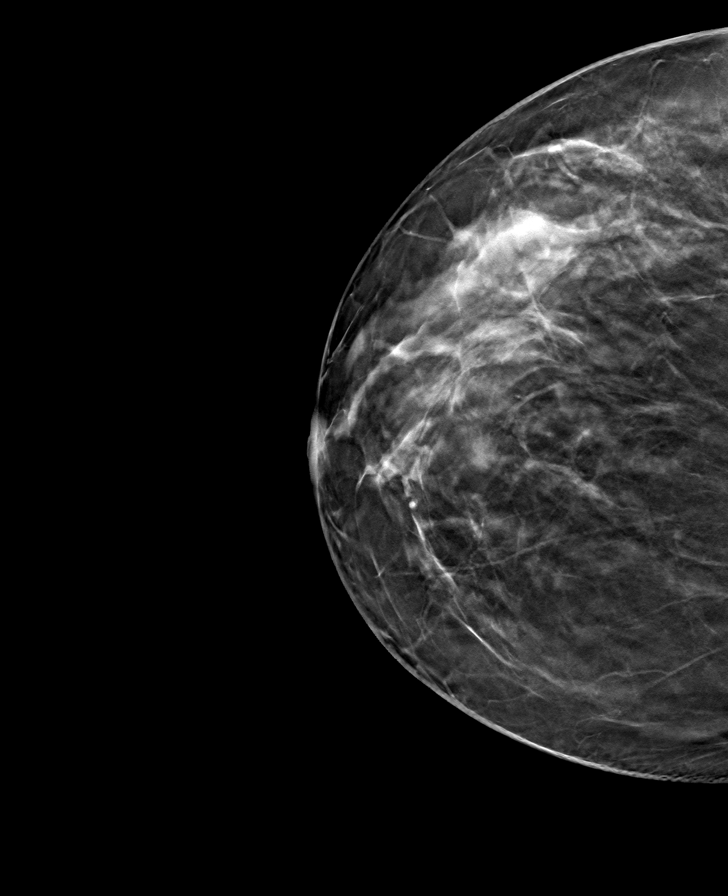

[R MLO tomo · tomo slice 45/90.0]
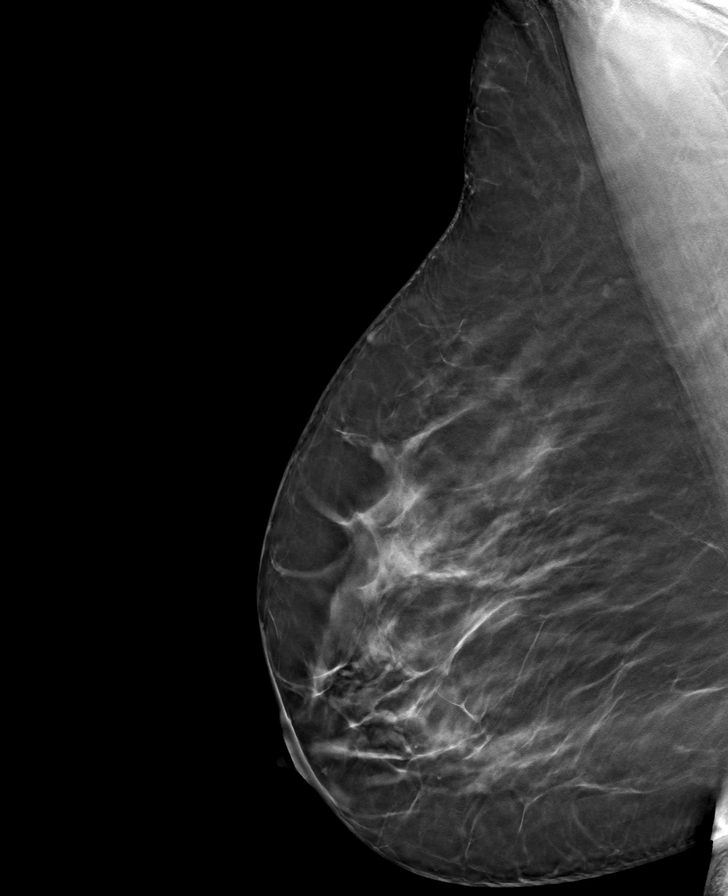

[L CC tomo · tomo slice 38/75.0]
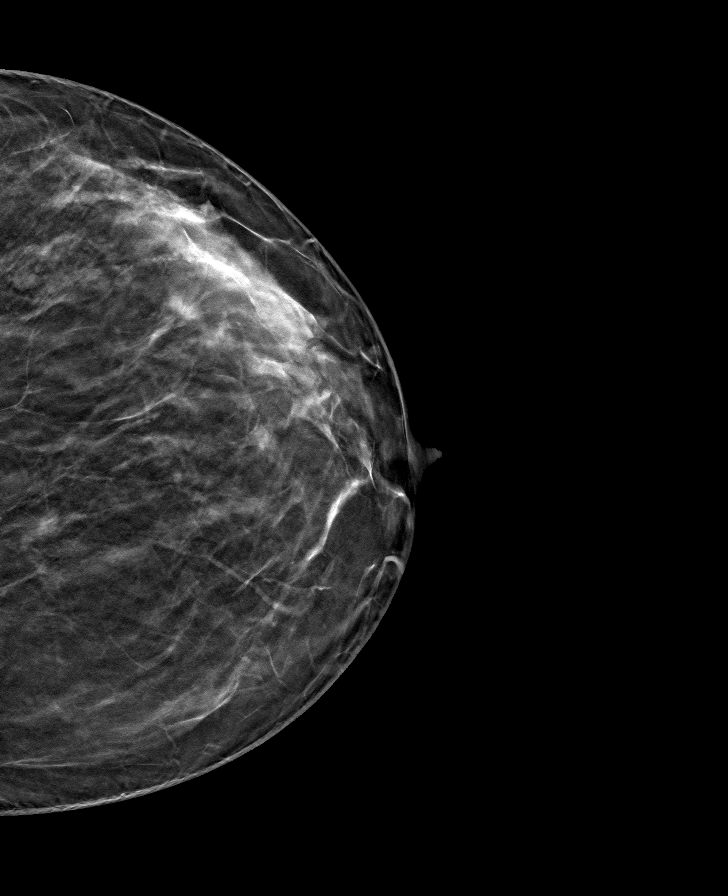

[8 of 24 positions shown; findings below may reference images not displayed]

ACR Breast Density Category c: The breast tissue is heterogeneously
dense, which may obscure small masses.
FINDINGS: There are no findings suspicious for malignancy.
IMPRESSION: No mammographic evidence of malignancy. A result letter of this
screening mammogram will be mailed directly to the patient.

RECOMMENDATION:
Screening mammogram in one year. (Code:Q3-W-BC3)

BI-RADS CATEGORY  1: Negative.

## 2023-06-03 LAB — EXTERNAL GENERIC LAB PROCEDURE: COLOGUARD: NEGATIVE

## 2023-07-02 ENCOUNTER — Other Ambulatory Visit: Payer: Self-pay | Admitting: Internal Medicine

## 2023-07-02 DIAGNOSIS — Z1231 Encounter for screening mammogram for malignant neoplasm of breast: Secondary | ICD-10-CM

## 2023-07-16 ENCOUNTER — Ambulatory Visit
Admission: RE | Admit: 2023-07-16 | Discharge: 2023-07-16 | Disposition: A | Discharge status: Home / Self Care | Payer: Commercial Managed Care - PPO | Source: Ambulatory Visit | Attending: Internal Medicine | Admitting: Internal Medicine

## 2023-07-16 DIAGNOSIS — Z1231 Encounter for screening mammogram for malignant neoplasm of breast: Secondary | ICD-10-CM | POA: Insufficient documentation
